# Patient Record
Sex: Male | Born: 1975
Health system: Southern US, Community
[De-identification: ages and names within clinical notes are randomized; demographics above are authoritative.]

## PROBLEM LIST (undated history)

## (undated) DIAGNOSIS — S060X9A Concussion with loss of consciousness of unspecified duration, initial encounter: Secondary | ICD-10-CM

## (undated) HISTORY — DX: Concussion with loss of consciousness of unspecified duration, initial encounter: S06.0X9A

---

## 2009-06-01 HISTORY — PX: SHOULDER SURGERY: SHX246

## 2010-06-01 DIAGNOSIS — S060X9A Concussion with loss of consciousness of unspecified duration, initial encounter: Secondary | ICD-10-CM

## 2010-06-01 DIAGNOSIS — S060XAA Concussion with loss of consciousness status unknown, initial encounter: Secondary | ICD-10-CM

## 2010-06-01 HISTORY — DX: Concussion with loss of consciousness of unspecified duration, initial encounter: S06.0X9A

## 2010-06-01 HISTORY — DX: Concussion with loss of consciousness status unknown, initial encounter: S06.0XAA

## 2012-08-26 ENCOUNTER — Ambulatory Visit (INDEPENDENT_AMBULATORY_CARE_PROVIDER_SITE_OTHER): Payer: 59 | Admitting: Family Medicine

## 2012-08-26 VITALS — BP 128/82 | HR 68 | Temp 98.0°F | Resp 17 | Ht 70.0 in | Wt 158.0 lb

## 2012-08-26 DIAGNOSIS — J069 Acute upper respiratory infection, unspecified: Secondary | ICD-10-CM

## 2012-08-26 DIAGNOSIS — J029 Acute pharyngitis, unspecified: Secondary | ICD-10-CM

## 2012-08-26 MED ORDER — CEFDINIR 300 MG PO CAPS: 300.0000 mg | ORAL_CAPSULE | Freq: Two times a day (BID) | ORAL | Status: DC

## 2012-08-26 MED ORDER — FIRST-DUKES MOUTHWASH MT SUSP: 5.0000 mL | Freq: Four times a day (QID) | OROMUCOSAL | Status: DC | PRN

## 2012-08-26 NOTE — Progress Notes (Signed)
[facility] [address], [place] [address] [phone]  Date:  08/26/2012   Name:  Carl Moss   DOB:  1975-06-29   MRN:  [id number]  PCP:  No primary provider on file.    Chief Complaint: Sinusitis, Nasal Congestion, Sore Throat, Chest Pain and Otalgia   History of Present Illness:  Carl Moss is a 37 y.o. very pleasant male patient who presents with the following:  Here today with illness.  Woke up this am with a stuffy nose, ear pain, sore throat.  Chest congestion.  No meds taken at home so far today.  He does have a dry cough.  He feels tired, but has not noted any body aches.  His chest felt tight this am- then he coughed and it seemed to loosen up, not bothering him right now  No GI symptoms.    He is otherwise generally healthy He is using a hand brace for CTS on the left hand, and is using naproxen as needed for this.  However he has not used this in a week or so NKDA.    Their 52 year old daughter was recently diagnosed with a "sinus infection" and is being treated with augmenin  There is no problem list on file for this patient.   History reviewed. No pertinent past medical history.  History reviewed. No pertinent past surgical history.  History  Substance Use Topics  . Smoking status: Current Every Day Smoker -- 0.75 packs/day for 17 years    Types: Cigarettes  . Smokeless tobacco: Not on file  . Alcohol Use: No    Family History  Problem Relation Age of Onset  . Hyperlipidemia Father   . Hypertension Father   . Angioedema Daughter     No Known Allergies  Medication list has been reviewed and updated.  No current outpatient prescriptions on file prior to visit.   No current facility-administered medications on file prior to visit.    Review of Systems:  As per HPI- otherwise negative.   Physical Examination: Filed Vitals:   08/26/12 1101  BP: 128/82  Pulse: 68  Temp: 98 F (36.7 C)  Resp: 17   Filed  Vitals:   08/26/12 1101  Height: 5\' 10"  (1.778 m)  Weight: 158 lb (71.668 kg)   Body mass index is 22.67 kg/(m^2). Ideal Body Weight: Weight in (lb) to have BMI = 25: 173.9  GEN: WDWN, NAD, Non-toxic, A & O x 3 HEENT: Atraumatic, Normocephalic. Neck supple. No masses, No LAD.  Bilateral TM wnl, oropharynx inflamed, no exudate or swelling, nasal cavity inflamed and congested.  PEERL,EOMI.   Ears and Nose: No external deformity. CV: RRR, No M/G/R. No JVD. No thrill. No extra heart sounds. PULM: CTA B, no wheezes, crackles, rhonchi. No retractions. No resp. distress. No accessory muscle use. ABD: S, NT, ND, +BS. No rebound. No HSM. EXTR: No c/c/e NEURO Normal gait.  PSYCH: Normally interactive. Conversant. Not depressed or anxious appearing.  Calm demeanor.   Results for orders placed in visit on 08/26/12  POCT RAPID STREP A (OFFICE)      Result Value Range   Rapid Strep A Screen Negative  Negative    Assessment and Plan: Acute pharyngitis - Plan: POCT rapid strep A, cefdinir (OMNICEF) 300 MG capsule, Diphenhyd-Hydrocort-Nystatin (FIRST-DUKES MOUTHWASH) SUSP  Acute upper respiratory infections of unspecified site DMM, omnicef rx to hold  Suspect viral URI- pt instructions: It seems that you have a viral URI  today.  Use the mouth wash as needed for your throat, and tylenol/ ibuprofen as needed.  Let me know if you are getting worse or not getting better- in that case we may need to have you start the omnicef rx.    Signed Abbe Amsterdam, MD

## 2012-08-26 NOTE — Patient Instructions (Addendum)
It seems that you have a viral URI today.  Use the mouth wash as needed for your throat, and tylenol/ ibuprofen as needed.  Let me know if you are getting worse or not getting better- in that case we may need to have you start the omnicef rx.

## 2012-11-18 ENCOUNTER — Ambulatory Visit (INDEPENDENT_AMBULATORY_CARE_PROVIDER_SITE_OTHER): Payer: 59 | Admitting: Internal Medicine

## 2012-11-18 ENCOUNTER — Ambulatory Visit: Payer: 59

## 2012-11-18 DIAGNOSIS — M25532 Pain in left wrist: Secondary | ICD-10-CM

## 2012-11-18 DIAGNOSIS — M25539 Pain in unspecified wrist: Secondary | ICD-10-CM

## 2012-11-18 MED ORDER — PREDNISONE 20 MG PO TABS: ORAL_TABLET | ORAL | Status: DC

## 2012-11-18 NOTE — Progress Notes (Signed)
  Subjective:    Patient ID: Carl Moss, male    DOB: 06-12-1975, 37 y.o.   MRN: [id number]  HPI complaining of left wrist pain for several, progressively worse He uses hands daily for making pizzas No known injury Started with pain at the ulnar aspect of the wrist intermittently This is progressed to being constant, interfering with work, with pain spreading to the forearm and the fourth and fifth digits, to include paresthesias and nocturnal symptoms He notices weakness above and beyond the pain  He is failed to respond to a brace and NSAIDs of 2 types    Review of Systems     Objective:   Physical Exam Left elbow and shoulder are normal Left wrist has tenderness to palpation over the ulnar aspect of the carpals both dorsal and volar/no swelling or redness Pain exaggerated with wrist extension Negative Tinels Grip 5 over 5 Finger thumb opposition intact No sensory loss noted        [facility] reading (PRIMARY) by  Dr. Aki Burdin=No bony abnormality    Assessment & Plan:  He is currently developing chronic wrist pain is interfering with his location and may represent early carpal tunnel syndrome or a carpal dissociative disorder Refer to Dr.Ortmann for evaluation  Trial of prednisone/continue wrist splint

## 2013-01-21 ENCOUNTER — Ambulatory Visit (INDEPENDENT_AMBULATORY_CARE_PROVIDER_SITE_OTHER): Payer: 59 | Admitting: Family Medicine

## 2013-01-21 ENCOUNTER — Encounter: Payer: Self-pay | Admitting: Family Medicine

## 2013-01-21 VITALS — BP 100/62 | HR 88 | Temp 98.5°F | Resp 16 | Ht 69.0 in | Wt 155.8 lb

## 2013-01-21 DIAGNOSIS — R51 Headache: Secondary | ICD-10-CM

## 2013-01-21 DIAGNOSIS — L0202 Furuncle of face: Secondary | ICD-10-CM

## 2013-01-21 MED ORDER — DOXYCYCLINE HYCLATE 100 MG PO CAPS: 100.0000 mg | ORAL_CAPSULE | Freq: Two times a day (BID) | ORAL | Status: DC

## 2013-01-21 NOTE — Progress Notes (Signed)
Procedure: Consent obtained.  Local anesthesia with 2% lido.  #18g needle used to open indurated area with purulence expressed.  #11 blade to make the incision larger and more purulence expressed.  Drsg placed with antibiotic ointment to prevent scabbing.

## 2013-01-21 NOTE — Progress Notes (Signed)
[address]   [place], [place]  [address]   [phone]  Subjective:    Patient ID: Carl Moss, male    DOB: 1975/07/17, 37 y.o.   MRN: [id number]  HPI This 37 y.o. male presents for evaluation of insect bite forehead.  Onset three days ago.  Awoke with red bump.  +HA  No fever/chills/sweats.  No itching.  +swelling; +Painful.  Sleeps heavily.  Applied topcial cream without improvement.  Tried to squeeze it.  No drainage.  No visible puncture.  Wife and daughter have recently had similar "spider bites" that have spontaneously resolved.   Review of Systems  Constitutional: Negative for fever, chills, diaphoresis and fatigue.  Skin: Positive for color change and wound. Negative for pallor and rash.  Neurological: Positive for headaches.    History reviewed. No pertinent past medical history.  Past Surgical History  Procedure Laterality Date  . Shoulder surgery      Prior to Admission medications   Medication Sig Start Date End Date Taking? Authorizing Provider  cefdinir (OMNICEF) 300 MG capsule Take 1 capsule (300 mg total) by mouth 2 (two) times daily. 08/26/12   Pearline Cables, MD  Diphenhyd-Hydrocort-Nystatin (FIRST-DUKES MOUTHWASH) SUSP Use as directed 5 mLs in the mouth or throat 4 (four) times daily as needed. Swish, gargle and then spit 08/26/12   Pearline Cables, MD  predniSONE (DELTASONE) 20 MG tablet 3/3/2/2/1/1 single daily dose for 6 days 11/18/12   Tonye Pearson, MD    No Known Allergies  History   Social History  . Marital Status: Married    Spouse Name: N/A    Number of Children: N/A  . Years of Education: N/A   Occupational History  . Not on file.   Social History Main Topics  . Smoking status: Current Every Day Smoker -- 0.75 packs/day for 17 years    Types: Cigarettes  . Smokeless tobacco: Not on file  . Alcohol Use: No  . Drug Use: No  . Sexual Activity: Yes    Birth Control/ Protection: None   Other Topics Concern  . Not on file    Social History Narrative  . No narrative on file    Family History  Problem Relation Age of Onset  . Hyperlipidemia Father   . Hypertension Father   . Angioedema Daughter        Objective:   Physical Exam  Nursing note and vitals reviewed. Constitutional: He appears well-developed and well-nourished. No distress.  HENT:  Head: Atraumatic.  Eyes: Conjunctivae and EOM are normal. Pupils are equal, round, and reactive to light.  Neck: Normal range of motion. Neck supple.  Skin: He is not diaphoretic. There is erythema.     Facial region at brows with 2.5 cm area of induration, erythema, tenderness.  Possible fluctuants mild and early; no pustule or vesicle.  Psychiatric: He has a normal mood and affect. His behavior is normal.       Assessment & Plan:  Facial pain  Carbuncle and furuncle of face  1.  Facial pain:  New.  Secondary to carbuncle and faruncle.  Ibuprofen 800mg  tid with food.  2. Carbuncle/faruncle facial: New. S/p I&D with 18 gauge needle; wound culture obtained; rx for Doxycycline provided. RTC immediately for increasing pain, redness, swelling.  Meds ordered this encounter  Medications  . doxycycline (VIBRAMYCIN) 100 MG capsule    Sig: Take 1 capsule (100 mg total) by mouth 2 (two) times daily.    Dispense:  20 capsule    Refill:  0

## 2013-01-21 NOTE — Patient Instructions (Addendum)
1.  HEAT TO AREA FOR 15-20 MINUTES. 2.  RETURN FOR FEVER, INCREASING PAIN, INCREASING REDNESS, INCREASING SWELLING.

## 2013-01-24 LAB — WOUND CULTURE
Gram Stain: NONE SEEN
Organism ID, Bacteria: NO GROWTH

## 2013-01-25 ENCOUNTER — Telehealth: Payer: Self-pay | Admitting: Radiology

## 2013-01-25 NOTE — Telephone Encounter (Signed)
Patients wife advised culture shows no growth.

## 2013-02-15 ENCOUNTER — Ambulatory Visit (INDEPENDENT_AMBULATORY_CARE_PROVIDER_SITE_OTHER): Payer: 59 | Admitting: Family Medicine

## 2013-02-15 VITALS — BP 112/62 | HR 72 | Temp 98.3°F | Resp 16 | Ht 70.0 in | Wt 158.0 lb

## 2013-02-15 DIAGNOSIS — D841 Defects in the complement system: Secondary | ICD-10-CM

## 2013-02-15 DIAGNOSIS — R634 Abnormal weight loss: Secondary | ICD-10-CM

## 2013-02-15 DIAGNOSIS — Z13220 Encounter for screening for lipoid disorders: Secondary | ICD-10-CM

## 2013-02-15 DIAGNOSIS — E889 Metabolic disorder, unspecified: Secondary | ICD-10-CM

## 2013-02-15 NOTE — Progress Notes (Signed)
[facility] [address] [place] [address] [phone]  Date:  02/15/2013   Name:  Carl Moss   DOB:  1975/07/01   MRN:  [id number]  PCP:  No primary provider on file.    Chief Complaint: Mass   History of Present Illness:  Carl Moss is a 37 y.o. very pleasant male patient who presents with the following:  He has noted a possible cyst on his forehead.  He was here about 4 weeks ago with same- treated with doxycycline.  He had a needle I and D- culture was negative.  The area is smaller but persistent.  He also noted a smaller, but similar area on the right side of his forehead about 2 months ago.  The areas are not painful, but he is concerned that they have not gone away.  He does not have any other cysts or skin issues  Daughter with hereditary angioedema type 3.  He would like to have testing today to see if he is a carrier.    He is fasting today and would like to have basic labs for his age as well.    His wife has noted some weight loss.  This seems to be due to him just not eating very much.  However, chart review shows stable weight over the last 6 months.   Wt Readings from Last 3 Encounters:  02/15/13 158 lb (71.668 kg)  01/21/13 155 lb 12.8 oz (70.67 kg)  08/26/12 158 lb (71.668 kg)     There are no active problems to display for this patient.   History reviewed. No pertinent past medical history.  Past Surgical History  Procedure Laterality Date  . Shoulder surgery      History  Substance Use Topics  . Smoking status: Current Every Day Smoker -- 0.75 packs/day for 17 years    Types: Cigarettes  . Smokeless tobacco: Not on file  . Alcohol Use: No    Family History  Problem Relation Age of Onset  . Hyperlipidemia Father   . Hypertension Father   . Angioedema Daughter     No Known Allergies  Medication list has been reviewed and updated.  Current Outpatient Prescriptions on File Prior to Visit  Medication Sig  Dispense Refill  . cefdinir (OMNICEF) 300 MG capsule Take 1 capsule (300 mg total) by mouth 2 (two) times daily.  20 capsule  0  . Diphenhyd-Hydrocort-Nystatin (FIRST-DUKES MOUTHWASH) SUSP Use as directed 5 mLs in the mouth or throat 4 (four) times daily as needed. Swish, gargle and then spit  237 mL  0  . doxycycline (VIBRAMYCIN) 100 MG capsule Take 1 capsule (100 mg total) by mouth 2 (two) times daily.  20 capsule  0  . predniSONE (DELTASONE) 20 MG tablet 3/3/2/2/1/1 single daily dose for 6 days  12 tablet  0   No current facility-administered medications on file prior to visit.    Review of Systems:  As per HPI- otherwise negative.   Physical Examination: Filed Vitals:   02/15/13 1259  BP: 112/62  Pulse: 72  Temp: 98.3 F (36.8 C)  Resp: 16   Filed Vitals:   02/15/13 1259  Height: 5\' 10"  (1.778 m)  Weight: 158 lb (71.668 kg)   Body mass index is 22.67 kg/(m^2). Ideal Body Weight: Weight in (lb) to have BMI = 25: 173.9  GEN: WDWN, NAD, Non-toxic, A & O x 3 HEENT: Atraumatic, Normocephalic. Neck supple. No masses, No LAD.  Bilateral TM wnl, oropharynx normal.  PEERL,EOMI.   Tiny, BB sized apparent mobile cyst over the right brow.  There is a larger, mobile cyst in between the brows, the size of a small pea.  Both are firm, circular and mobile consistent with cysts Ears and Nose: No external deformity. CV: RRR, No M/G/R. No JVD. No thrill. No extra heart sounds. PULM: CTA B, no wheezes, crackles, rhonchi. No retractions. No resp. distress. No accessory muscle use. EXTR: No c/c/e NEURO Normal gait.  PSYCH: Normally interactive. Conversant. Not depressed or anxious appearing.  Calm demeanor.    Assessment and Plan: Screening for hyperlipidemia - Plan: Lipid panel, CBC with Differential  Loss of weight - Plan: Comprehensive metabolic panel, CBC with Differential, CANCELED: POCT CBC  Hereditary angioedema - Plan: C4 complement, C1 Esterase Inhibitor Panel, CBC with  Differential  Labs as above.  Cysts on forehead.  He admits to trying to squeeze the once between his brows some, which may be increasing its size.  He will let me know how they do over the next month or so, if the cysts do not decrease in size he will let me know and I will refer to derm or plastics   Signed Abbe Amsterdam, MD

## 2013-02-15 NOTE — Patient Instructions (Addendum)
I will be in touch with your labs.   If you decide you would like to do anything further about your cysts please let me know

## 2013-02-17 LAB — LIPID PANEL
Cholesterol: 156 mg/dL (ref 0–200)
HDL: 36 mg/dL — ABNORMAL LOW (ref >39)
Total CHOL/HDL Ratio: 4.3 Ratio
VLDL: 34 mg/dL (ref 0–40)

## 2013-02-17 LAB — COMPREHENSIVE METABOLIC PANEL
ALT: 12 U/L (ref 0–53)
AST: 16 U/L (ref 0–37)
Albumin: 4.7 g/dL (ref 3.5–5.2)
Calcium: 10.1 mg/dL (ref 8.4–10.5)
Chloride: 104 mEq/L (ref 96–112)
Potassium: 4.4 mEq/L (ref 3.5–5.3)
Total Protein: 7.6 g/dL (ref 6.0–8.3)

## 2013-02-17 LAB — CBC WITH DIFFERENTIAL/PLATELET
Basophils Relative: 0 % (ref 0–1)
Eosinophils Absolute: 0.3 10*3/uL (ref 0.0–0.7)
Eosinophils Relative: 3 % (ref 0–5)
HCT: 44.9 % (ref 39.0–52.0)
Hemoglobin: 15.1 g/dL (ref 13.0–17.0)
MCH: 29.4 pg (ref 26.0–34.0)
MCHC: 33.6 g/dL (ref 30.0–36.0)
Monocytes Absolute: 0.6 10*3/uL (ref 0.1–1.0)
Monocytes Relative: 7 % (ref 3–12)

## 2013-02-22 ENCOUNTER — Telehealth: Payer: Self-pay | Admitting: Family Medicine

## 2013-02-22 NOTE — Telephone Encounter (Signed)
Gave patients wife his lab results

## 2013-02-23 ENCOUNTER — Encounter: Payer: Self-pay | Admitting: Family Medicine

## 2013-06-10 ENCOUNTER — Ambulatory Visit (INDEPENDENT_AMBULATORY_CARE_PROVIDER_SITE_OTHER): Payer: 59 | Admitting: Family Medicine

## 2013-06-10 VITALS — BP 104/62 | HR 74 | Temp 98.7°F | Resp 16 | Ht 69.0 in | Wt 151.0 lb

## 2013-06-10 DIAGNOSIS — S0550XA Penetrating wound with foreign body of unspecified eyeball, initial encounter: Secondary | ICD-10-CM

## 2013-06-10 DIAGNOSIS — H44703 Unspecified retained (old) intraocular foreign body, nonmagnetic, bilateral: Secondary | ICD-10-CM

## 2013-06-10 MED ORDER — TOBRAMYCIN 0.3 % OP SOLN: 1.0000 [drp] | Freq: Four times a day (QID) | OPHTHALMIC | Status: DC
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
## 2013-06-10 NOTE — Patient Instructions (Signed)
Wash pillow cases °

## 2014-03-15 ENCOUNTER — Ambulatory Visit (INDEPENDENT_AMBULATORY_CARE_PROVIDER_SITE_OTHER): Payer: 59 | Admitting: Family Medicine

## 2014-03-15 VITALS — BP 112/68 | HR 59 | Temp 97.2°F | Resp 16 | Ht 69.0 in | Wt 165.8 lb

## 2014-03-15 DIAGNOSIS — L723 Sebaceous cyst: Secondary | ICD-10-CM

## 2014-03-15 NOTE — Progress Notes (Signed)
I was directly involved in the patient's care and actively participated during the procedure.  

## 2014-03-15 NOTE — Progress Notes (Signed)
   [id number] secondary infection.  Local wound care reviewed.   No orders of the defined types were placed in this encounter.   I personally performed the services described in this documentation, which was scribed in my presence.  The recorded information has been reviewed and is accurate.  Abdelaziz Westenberger, M.D.  [facility]  [place] [address] [place], [place]  [address] [phone] Artho35mr C<1610ADArtho40mr C ptaininArthoGrace Hospital6Private [phone] linic PLLC1610 CArtho57mr C ptain>pt1610inArtho40mr C pta1610neArtho9mr aptainix [facility] Dupag773 [phone] [id number] [phone]

## 2014-03-15 NOTE — Progress Notes (Signed)
Procedure: Consent obtained. Used 1% Lidocaine with Epi for anesthesia.  Anesthesia successful.  Cleaned with Povidine.  15 blade for 1cm superficial excision through the epidermis transverse plane and across the punctum.  1cm sebaceous cyst opened with material drainage.  Applied pressure for more ample drainage.  Sebaceous material and cyst bag excised. 6-0 ethicon used in 2 simple interrupted sutures for closure.

## 2014-03-19 ENCOUNTER — Ambulatory Visit (INDEPENDENT_AMBULATORY_CARE_PROVIDER_SITE_OTHER): Payer: 59 | Admitting: Family Medicine

## 2014-03-19 DIAGNOSIS — Z4802 Encounter for removal of sutures: Secondary | ICD-10-CM

## 2014-03-19 NOTE — Progress Notes (Signed)
   Subjective:    Patient ID: Carl Moss, male    DOB: 05/07/1976, 38 y.o.   MRN: [id number]  HPI Patient presents today for suture removal. Had two sutures placed 10/15 following sebaceous cyst removal. Has tolerated sutures without redness or drainage.    Review of Systems     Objective:   Physical Exam  Constitutional: He is oriented to person, place, and time. He appears well-developed and well-nourished.  HENT:  Head: Normocephalic.    Eyes: Conjunctivae are normal.  Neck: Normal range of motion. Neck supple.  Pulmonary/Chest: Effort normal.  Musculoskeletal: Normal range of motion.  Neurological: He is alert and oriented to person, place, and time.  Skin: Skin is warm and dry.  Psychiatric: He has a normal mood and affect. His behavior is normal. Judgment and thought content normal.       Assessment & Plan:  1. Visit for suture removal -ok for suture removal.   Emi Belfasteborah B. Dominique Ressel, FNP-BC  [facility]  03/19/2014 10:41 AM

## 2014-09-11 IMAGING — CR DG WRIST COMPLETE 3+V*L*
2 series · 2 of 2 positions shown · non-contrast
Comparison: None.

CLINICAL DATA: Wrist pain.

LEFT WRIST - COMPLETE 3+ VIEW

[PA]
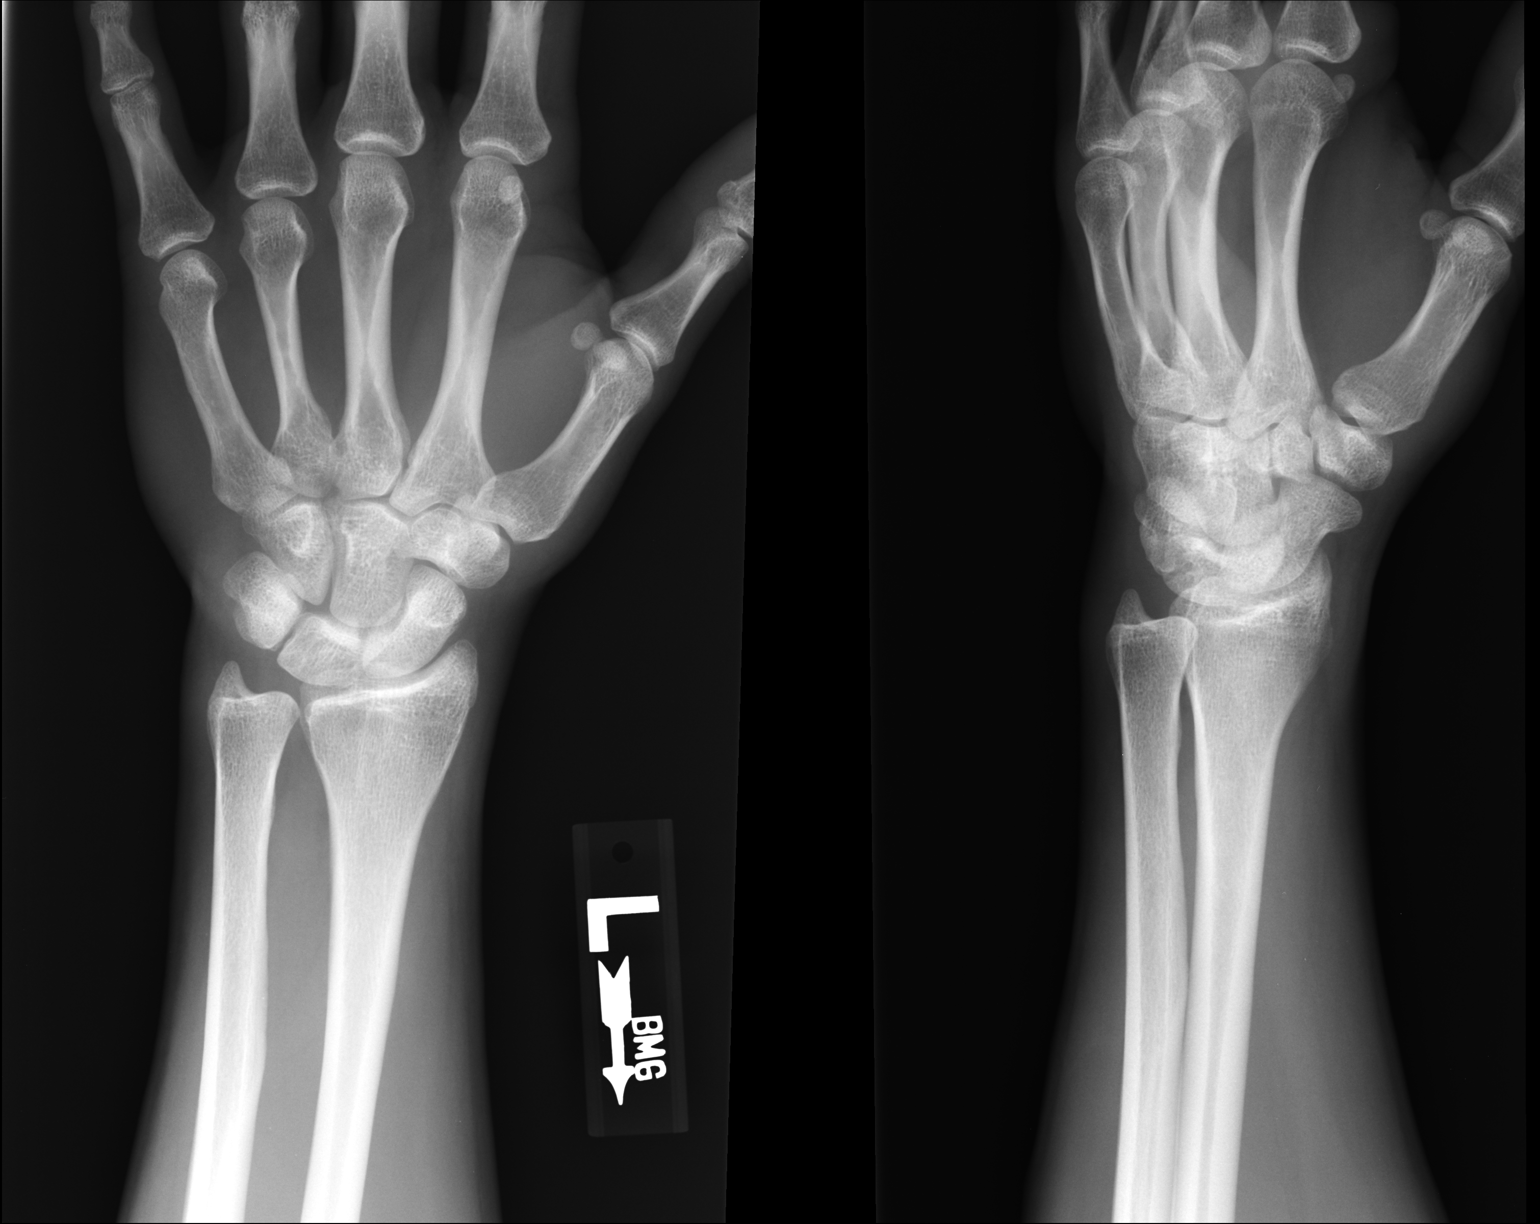

[lateral]
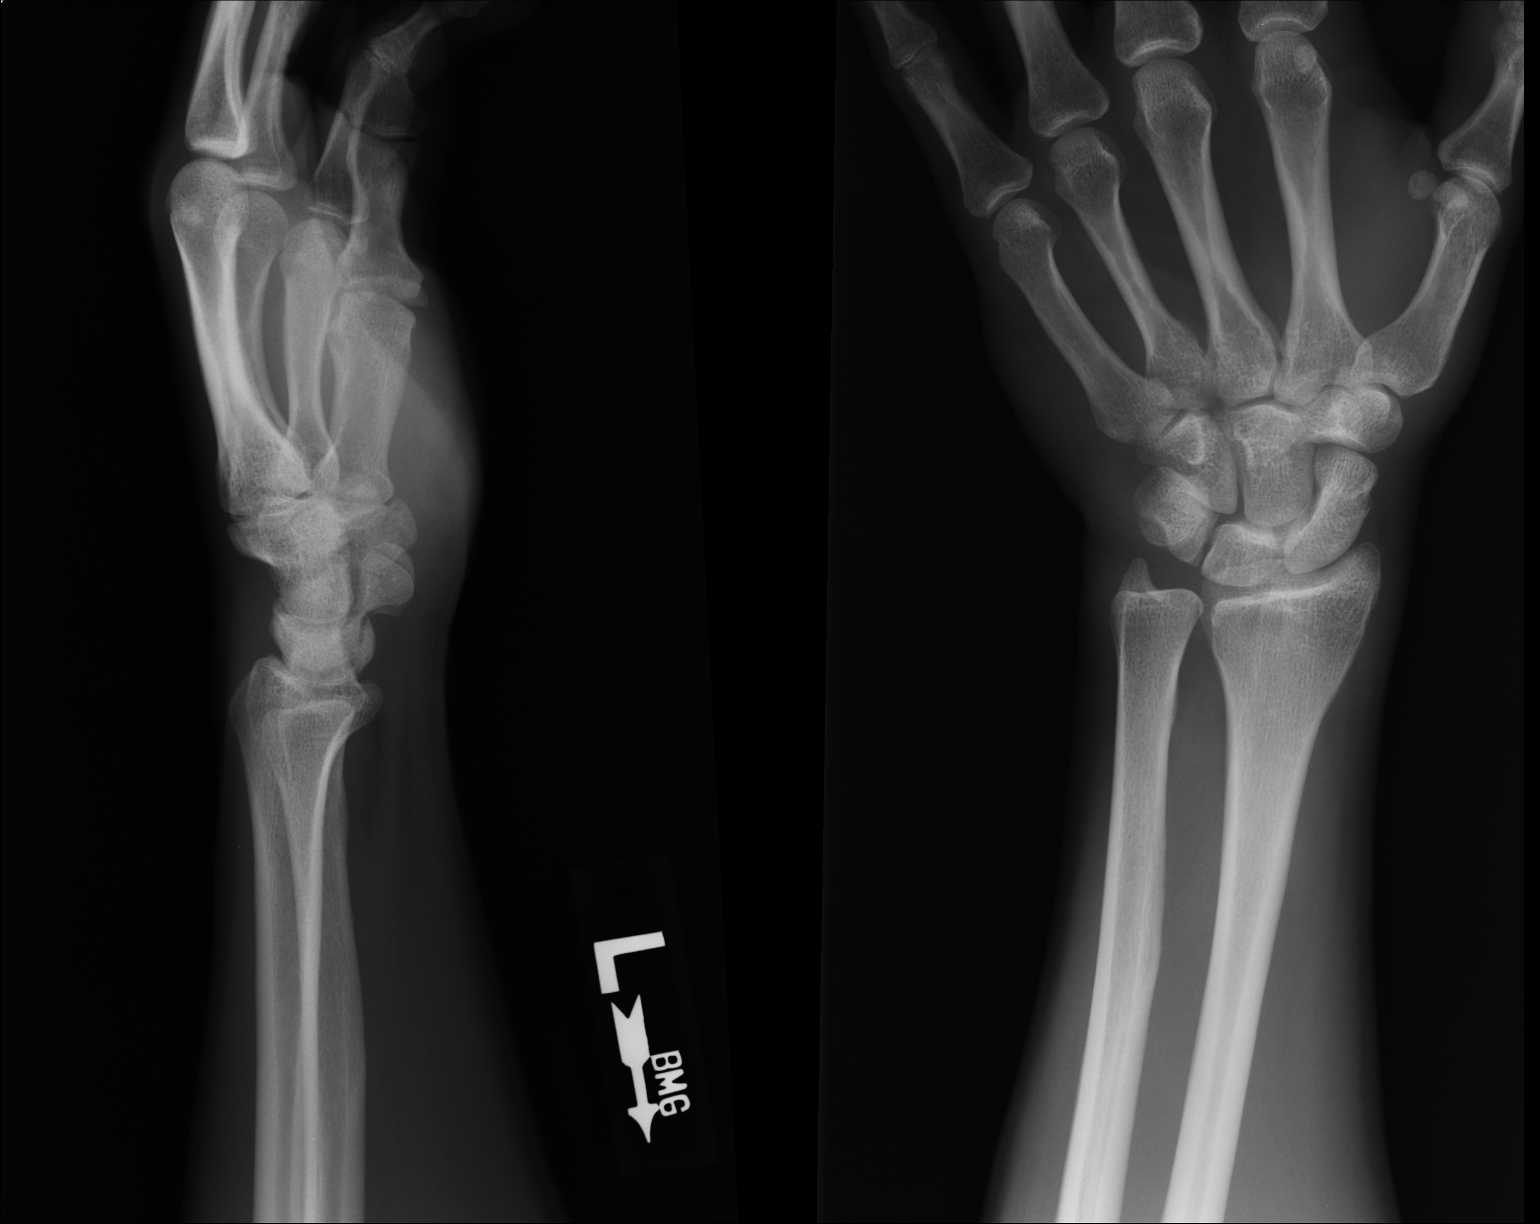

[2 of 2 positions shown; findings below may reference images not displayed]

FINDINGS: No acute bony abnormality.  Specifically, no fracture,
subluxation, or dislocation.  Soft tissues are intact. Joint spaces
are maintained.  Normal bone mineralization.
IMPRESSION: Negative.

Clinically significant discrepancy from primary report, if
provided: None

## 2014-10-01 ENCOUNTER — Ambulatory Visit (INDEPENDENT_AMBULATORY_CARE_PROVIDER_SITE_OTHER): Payer: 59 | Admitting: Family Medicine

## 2014-10-01 VITALS — BP 100/62 | HR 63 | Temp 98.0°F | Resp 16 | Ht 69.0 in | Wt 162.4 lb

## 2014-10-01 DIAGNOSIS — G44309 Post-traumatic headache, unspecified, not intractable: Secondary | ICD-10-CM

## 2014-10-01 DIAGNOSIS — Z87820 Personal history of traumatic brain injury: Secondary | ICD-10-CM | POA: Diagnosis not present

## 2014-10-01 DIAGNOSIS — S060X0A Concussion without loss of consciousness, initial encounter: Secondary | ICD-10-CM

## 2014-10-01 MED ORDER — ACETAMINOPHEN-CODEINE #2 300-15 MG PO TABS: 1.0000 | ORAL_TABLET | ORAL | Status: DC | PRN
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
## 2014-10-03 NOTE — Progress Notes (Signed)
Patient ID: Carl Moss, male   DOB: 01/25/1976, 39 y.o.   MRN: [id number] Reviewed documentation and agree w/ assessment and plan. Norberto SorensonEva Braylie Badami, MD MPH

## 2014-10-09 ENCOUNTER — Ambulatory Visit (INDEPENDENT_AMBULATORY_CARE_PROVIDER_SITE_OTHER): Payer: 59 | Admitting: Diagnostic Neuroimaging

## 2014-10-09 ENCOUNTER — Encounter: Payer: Self-pay | Admitting: Diagnostic Neuroimaging

## 2014-10-09 VITALS — BP 116/72 | HR 65 | Ht 69.0 in | Wt 162.7 lb

## 2014-10-09 DIAGNOSIS — G44309 Post-traumatic headache, unspecified, not intractable: Secondary | ICD-10-CM | POA: Diagnosis not present

## 2014-10-09 DIAGNOSIS — F0781 Postconcussional syndrome: Secondary | ICD-10-CM | POA: Diagnosis not present

## 2014-10-09 MED ORDER — AMITRIPTYLINE HCL 25 MG PO TABS: 25.0000 mg | ORAL_TABLET | Freq: Every day | ORAL | Status: DC

## 2014-10-09 NOTE — Progress Notes (Signed)
[facility]  PATIENT: Carl Moss DOB: 08/11/1975  REFERRING CLINICIAN: Chestine Sporelark HISTORY FROM: patient and wife  REASON FOR VISIT: new consult    HISTORICAL  CHIEF COMPLAINT:  Chief Complaint  Patient presents with  . New Evaluation    history of concussion    HISTORY OF PRESENT ILLNESS:   39 year old right-handed male here for evaluation of concussion and postconcussion symptoms.  09/17/2014, patient struck his head when exiting a shed on overhead beam. Initially had pain on the top of his head but was able to continue his activities. One to 2 days later he felt fogginess, disconnected feeling, ringing in ears, severe headaches, increased sleep, photophobia, confusion. Over the next 1 week symptoms continue to worsen. He was having headaches which increased as he exerted himself. It sharp and dull aching sensations. Intermittent nausea. He was sleeping more than usual. Normally he has a slightly abnormal sleep schedule, typically falling asleep between 2 and 4 in the morning and waking up around noon. Now he is waking up around 4 PM.  No symptoms in his neck, arms, legs. The numbness tingling or weakness.   He has history of concussion 10/19/2010.   REVIEW OF SYSTEMS: Full 14 system review of systems performed and notable only for fatigue ringing in ears blurred vision eye pain memory loss confusion headache dizziness sleepiness too much sleep decreased energy.   ALLERGIES: No Known Allergies  HOME MEDICATIONS: Outpatient Prescriptions Prior to Visit  Medication Sig Dispense Refill  . acetaminophen-codeine (TYLENOL #2) 300-15 MG per tablet Take 1 tablet by mouth every 4 (four) hours as needed for moderate pain. 30 tablet 0   No facility-administered medications prior to visit.    PAST MEDICAL HISTORY: Past Medical History  Diagnosis Date  . Concussion 2012    PAST SURGICAL HISTORY: Past Surgical History  Procedure Laterality Date  .  Shoulder surgery Right 2011    FAMILY HISTORY: Family History  Problem Relation Age of Onset  . Hyperlipidemia Father   . Hypertension Father   . Angioedema Daughter     SOCIAL HISTORY:  History   Social History  . Marital Status: Married    Spouse Name: Huntley DecSara  . Number of Children: 2  . Years of Education: college    Occupational History  . [employer]    Social History Main Topics  . Smoking status: Former Smoker -- 0.75 packs/day for 17 years    Types: Cigarettes    Quit date: 04/15/2013  . Smokeless tobacco: Never Used  . Alcohol Use: No  . Drug Use: No  . Sexual Activity: Yes    Birth Control/ Protection: None   Other Topics Concern  . Not on file   Social History Narrative   Lives at home with his wife and children   Drinks about 18 oz of caffeine a day      PHYSICAL EXAM  Filed Vitals:   10/09/14 1313  BP: 116/72  Pulse: 65  Height: 5\' 9"  (1.753 m)  Weight: 162 lb 11.2 oz (73.8 kg)    Body mass index is 24.02 kg/(m^2).   Visual Acuity Screening   Right eye Left eye Both eyes  Without correction: 20/30 20/30   With correction:       No flowsheet data found.  GENERAL EXAM: Patient is in no distress; well developed, nourished and groomed; neck is supple; SLIGHT DISCOMFORT APPEARANCE; SITTING IN DARKENED ROOM  CARDIOVASCULAR: Regular rate and rhythm, no murmurs, no carotid bruits  NEUROLOGIC: MENTAL STATUS: awake, alert, oriented to person, place and time, recent and remote memory intact, normal attention and concentration, language fluent, comprehension intact, naming intact, fund of knowledge appropriate CRANIAL NERVE: no papilledema on fundoscopic exam, pupils equal and reactive to light, visual fields full to confrontation, extraocular muscles intact, no nystagmus, facial sensation and strength symmetric, hearing intact, palate elevates symmetrically, uvula midline, shoulder shrug symmetric, tongue midline. MOTOR: normal bulk and tone, full  strength in the BUE, BLE SENSORY: normal and symmetric to light touch, pinprick, temperature, vibration  COORDINATION: finger-nose-finger, fine finger movements normal REFLEXES: deep tendon reflexes present and symmetric GAIT/STATION: narrow based gait; able to walk on toes, heels and tandem; romberg is negative    DIAGNOSTIC DATA (LABS, IMAGING, TESTING) - I reviewed patient records, labs, notes, testing and imaging myself where available.  Lab Results  Component Value Date   WBC 8.9 02/15/2013   HGB 15.1 02/15/2013   HCT 44.9 02/15/2013   MCV 87.4 02/15/2013   PLT 240 02/15/2013      Component Value Date/Time   NA 145 02/15/2013 1311   K 4.4 02/15/2013 1311   CL 104 02/15/2013 1311   CO2 28 02/15/2013 1311   GLUCOSE 92 02/15/2013 1311   BUN 12 02/15/2013 1311   CREATININE 0.91 02/15/2013 1311   CALCIUM 10.1 02/15/2013 1311   PROT 7.6 02/15/2013 1311   ALBUMIN 4.7 02/15/2013 1311   AST 16 02/15/2013 1311   ALT 12 02/15/2013 1311   ALKPHOS 57 02/15/2013 1311   BILITOT 0.8 02/15/2013 1311   Lab Results  Component Value Date   CHOL 156 02/15/2013   HDL 36* 02/15/2013   LDLCALC 86 02/15/2013   TRIG 168* 02/15/2013   CHOLHDL 4.3 02/15/2013   No results found for: HGBA1C No results found for: VITAMINB12 No results found for: TSH    ASSESSMENT AND PLAN  39 y.o. year old male here with concussion on 09/17/14.   Dx:  Post concussion syndrome - Plan: MR Brain Wo Contrast  Post-concussion headache - Plan: MR Brain Wo Contrast   PLAN: - check MRI brain to rule contusion or other secondary causes of HA --> patient would like to hold off for now and see if symptoms improve - trial of amitriptyline 25mg  qhs - rest, hydration, sleep, exercises and gradual increase in activities advised  Orders Placed This Encounter  Procedures  . MR Brain Wo Contrast   Meds ordered this encounter  Medications  . amitriptyline (ELAVIL) 25 MG tablet    Sig: Take 1 tablet (25 mg  total) by mouth at bedtime.    Dispense:  30 tablet    Refill:  3   Return in about 1 month (around 11/09/2014).    Suanne MarkerVIKRAM R. PENUMALLI, MD 10/09/2014, 2:10 PM Certified in Neurology, Neurophysiology and Neuroimaging  [facility] [address], [address] [place], [place] [address] [phone]

## 2014-10-09 NOTE — Patient Instructions (Signed)
Try amitriptyline 25mg  at bedtime.   I will check MRI brain.

## 2014-10-11 ENCOUNTER — Encounter: Payer: Self-pay | Admitting: Diagnostic Neuroimaging

## 2014-10-18 ENCOUNTER — Other Ambulatory Visit: Payer: 59

## 2014-11-12 ENCOUNTER — Ambulatory Visit (INDEPENDENT_AMBULATORY_CARE_PROVIDER_SITE_OTHER): Payer: 59 | Admitting: Family Medicine

## 2014-11-12 VITALS — BP 104/54 | HR 67 | Temp 98.2°F | Resp 16 | Ht 70.0 in | Wt 161.0 lb

## 2014-11-12 DIAGNOSIS — R590 Localized enlarged lymph nodes: Secondary | ICD-10-CM | POA: Diagnosis not present

## 2014-11-12 DIAGNOSIS — L739 Follicular disorder, unspecified: Secondary | ICD-10-CM | POA: Diagnosis not present

## 2014-11-12 MED ORDER — DOXYCYCLINE HYCLATE 100 MG PO CAPS: 100.0000 mg | ORAL_CAPSULE | Freq: Two times a day (BID) | ORAL | Status: DC

## 2014-11-12 NOTE — Progress Notes (Signed)
History and physical examinations obtained with PA [facility].  Agree with assessment and plan.  Eulia Hatcher Paulita Fujita, M.D. [facility] [address] [place], [place]  [address] [phone] phone [phone] fax

## 2014-11-12 NOTE — Progress Notes (Signed)
    MRN: [id number] DOB: 1975/08/03  Subjective:   Carl Moss is a 39 y.o. male presenting for chief complaint of Mass; Fatigue; Fever; and Headache  Reports 2 day history of left sided mass in arm pit. Mass is tender and has been worsening. Also reports subjective fever, malaise. Denies discharge, bleeding, swelling of shoulder or arm, wound. Of note, patient has had a cyst before over his forehead and has been dealing with folliculitis over his neck. He has not been seen for this and has not undergone any antibiotics. Denies any other aggravating or relieving factors, no other questions or concerns.  Flabio has a current medication list which includes the following prescription(s): acetaminophen, acetaminophen-codeine, ibuprofen, and amitriptyline. He has No Known Allergies.  Kyaire  has a past medical history of Concussion (2012). Also  has past surgical history that includes Shoulder surgery (Right, 2011).  ROS As in subjective.  Objective:   Vitals: BP 104/54 mmHg  Pulse 67  Temp(Src) 98.2 F (36.8 C)  Resp 16  Ht 5\' 10"  (1.778 m)  Wt 161 lb (73.029 kg)  BMI 23.10 kg/m2  SpO2 98%  Physical Exam  Constitutional: He appears well-developed and well-nourished.  Cardiovascular: Normal rate.   Pulmonary/Chest: Effort normal.    Lymphadenopathy:    He has axillary adenopathy (left sided with associated erythema).  There is ~0.5-1cm tender mass in left axillary region.  Skin: Skin is warm and dry. Rash noted. There is erythema. No pallor.   Assessment and Plan :   1. Folliculitis 2. Lymphadenopathy, axillary - Will start doxycycline to cover for infectious process, advise warm compresses and ibuprofen/NSAID for her fever and inflammation. Recommended he return to clinic in 3-4 days if there's no improvement in symptoms and definitely if his symptoms worsen as he may need I&D. Patient agreed.  Wallis Bamberg, PA-C [facility] [phone] 11/12/2014 5:37 PM

## 2014-11-12 NOTE — Patient Instructions (Signed)
Folliculitis  Folliculitis is redness, soreness, and swelling (inflammation) of the hair follicles. This condition can occur anywhere on the body. People with weakened immune systems, diabetes, or obesity have a greater risk of getting folliculitis. CAUSES  Bacterial infection. This is the most common cause.  Fungal infection.  Viral infection.  Contact with certain chemicals, especially oils and tars. Long-term folliculitis can result from bacteria that live in the nostrils. The bacteria may trigger multiple outbreaks of folliculitis over time. SYMPTOMS Folliculitis most commonly occurs on the scalp, thighs, legs, back, buttocks, and areas where hair is shaved frequently. An early sign of folliculitis is a small, white or yellow, pus-filled, itchy lesion (pustule). These lesions appear on a red, inflamed follicle. They are usually less than 0.2 inches (5 mm) wide. When there is an infection of the follicle that goes deeper, it becomes a boil or furuncle. A group of closely packed boils creates a larger lesion (carbuncle). Carbuncles tend to occur in hairy, sweaty areas of the body. DIAGNOSIS  Your caregiver can usually tell what is wrong by doing a physical exam. A sample may be taken from one of the lesions and tested in a lab. This can help determine what is causing your folliculitis. TREATMENT  Treatment may include:  Applying warm compresses to the affected areas.  Taking antibiotic medicines orally or applying them to the skin.  Draining the lesions if they contain a large amount of pus or fluid.  Laser hair removal for cases of long-lasting folliculitis. This helps to prevent regrowth of the hair. HOME CARE INSTRUCTIONS  Apply warm compresses to the affected areas as directed by your caregiver.  If antibiotics are prescribed, take them as directed. Finish them even if you start to feel better.  You may take over-the-counter medicines to relieve itching.  Do not shave  irritated skin.  Follow up with your caregiver as directed. SEEK IMMEDIATE MEDICAL CARE IF:   You have increasing redness, swelling, or pain in the affected area.  You have a fever. MAKE SURE YOU:  Understand these instructions.  Will watch your condition.  Will get help right away if you are not doing well or get worse. Document Released: 07/27/2001 Document Revised: 11/17/2011 Document Reviewed: 08/18/2011 [facility] Patient Information 2015 [place], [place]. This information is not intended to replace advice given to you by your health care provider. Make sure you discuss any questions you have with your health care provider.   Swollen Lymph Nodes The lymphatic system filters fluid from around cells. It is like a system of blood vessels. These channels carry lymph instead of blood. The lymphatic system is an important part of the immune (disease fighting) system. When people talk about "swollen glands in the neck," they are usually talking about swollen lymph nodes. The lymph nodes are like the little traps for infection. You and your caregiver may be able to feel lymph nodes, especially swollen nodes, in these common areas: the groin (inguinal area), armpits (axilla), and above the clavicle (supraclavicular). You may also feel them in the neck (cervical) and the back of the head just above the hairline (occipital). Swollen glands occur when there is any condition in which the body responds with an allergic type of reaction. For instance, the glands in the neck can become swollen from insect bites or any type of minor infection on the head. These are very noticeable in children with only minor problems. Lymph nodes may also become swollen when there is a tumor or problem  with the lymphatic system, such as Hodgkin's disease. TREATMENT   Most swollen glands do not require treatment. They can be observed (watched) for a short period of time, if your caregiver feels it is necessary. Most of the  time, observation is not necessary.  Antibiotics (medicines that kill germs) may be prescribed by your caregiver. Your caregiver may prescribe these if he or she feels the swollen glands are due to a bacterial (germ) infection. Antibiotics are not used if the swollen glands are caused by a virus. HOME CARE INSTRUCTIONS   Take medications as directed by your caregiver. Only take over-the-counter or prescription medicines for pain, discomfort, or fever as directed by your caregiver. SEEK MEDICAL CARE IF:   If you begin to run a temperature greater than 102 F (38.9 C), or as your caregiver suggests. MAKE SURE YOU:   Understand these instructions.  Will watch your condition.  Will get help right away if you are not doing well or get worse. Document Released: 05/08/2002 Document Revised: 08/10/2011 Document Reviewed: 05/18/2005 [facility] Patient Information 2015 [place], [place]. This information is not intended to replace advice given to you by your health care provider. Make sure you discuss any questions you have with your health care provider.

## 2014-11-15 ENCOUNTER — Encounter: Payer: Self-pay | Admitting: Diagnostic Neuroimaging

## 2014-11-15 ENCOUNTER — Ambulatory Visit (INDEPENDENT_AMBULATORY_CARE_PROVIDER_SITE_OTHER): Payer: 59 | Admitting: Diagnostic Neuroimaging

## 2014-11-15 VITALS — BP 110/70 | HR 83 | Temp 97.8°F | Ht 69.0 in | Wt 164.7 lb

## 2014-11-15 DIAGNOSIS — G44309 Post-traumatic headache, unspecified, not intractable: Secondary | ICD-10-CM

## 2014-11-15 DIAGNOSIS — F0781 Postconcussional syndrome: Secondary | ICD-10-CM

## 2014-11-15 NOTE — Progress Notes (Signed)
[facility]  PATIENT: Carl Moss DOB: [birth year]  REFERRING CLINICIAN: Chestine Spore HISTORY FROM: patient  REASON FOR VISIT: follow up   HISTORICAL  CHIEF COMPLAINT:  Chief Complaint  Patient presents with  . Follow-up    hx concussion, HA, rm 7, "nonstop HA x 5 days"  . Migraine    HISTORY OF PRESENT ILLNESS:   UPDATE 11/15/14: Since last visit, was doing better, took amitriptyline x 1 week, felt better then stopped it. Was getting better until last weekend, then had fever and left armpit swelling (? folliculitis). Now on doxycycline, and back on amitriptyline. Today feels a little bit better. Still with photosensitivity since the concussion.   PRIOR HPI (10/09/14): 39 year old right-handed male here for evaluation of concussion and postconcussion symptoms. 09/17/2014, patient struck his head when exiting a shed on overhead beam. Initially had pain on the top of his head but was able to continue his activities. One to 2 days later he felt fogginess, disconnected feeling, ringing in ears, severe headaches, increased sleep, photophobia, confusion. Over the next 1 week symptoms continue to worsen. He was having headaches which increased as he exerted himself. It sharp and dull aching sensations. Intermittent nausea. He was sleeping more than usual. Normally he has a slightly abnormal sleep schedule, typically falling asleep between 2 and 4 in the morning and waking up around noon. Now he is waking up around 4 PM. No symptoms in his neck, arms, legs. The numbness tingling or weakness. He has history of concussion 10/19/2010.   REVIEW OF SYSTEMS: Full 14 system review of systems performed and notable only for fatigue ringing in ears light sens headache dizziness swollen lymph nodes.   ALLERGIES: No Known Allergies  HOME MEDICATIONS: Outpatient Prescriptions Prior to Visit  Medication Sig Dispense Refill  . acetaminophen (TYLENOL) 500 MG tablet Take 1,000 mg by mouth  every 6 (six) hours as needed.    Marland Kitchen amitriptyline (ELAVIL) 25 MG tablet Take 1 tablet (25 mg total) by mouth at bedtime. 30 tablet 3  . doxycycline (VIBRAMYCIN) 100 MG capsule Take 1 capsule (100 mg total) by mouth 2 (two) times daily. 20 capsule 0  . ibuprofen (ADVIL,MOTRIN) 600 MG tablet Take 600 mg by mouth every 6 (six) hours as needed.    Marland Kitchen acetaminophen-codeine (TYLENOL #2) 300-15 MG per tablet Take 1 tablet by mouth every 4 (four) hours as needed for moderate pain. (Patient not taking: Reported on 11/15/2014) 30 tablet 0   No facility-administered medications prior to visit.    PAST MEDICAL HISTORY: Past Medical History  Diagnosis Date  . Concussion 2012    PAST SURGICAL HISTORY: Past Surgical History  Procedure Laterality Date  . Shoulder surgery Right 2011    FAMILY HISTORY: Family History  Problem Relation Age of Onset  . Hyperlipidemia Father   . Hypertension Father   . Angioedema Daughter     SOCIAL HISTORY:  History   Social History  . Marital Status: Married    Spouse Name: Huntley Dec  . Number of Children: 2  . Years of Education: college    Occupational History  . [employer]    Social History Main Topics  . Smoking status: Former Smoker -- 0.75 packs/day for 17 years    Types: Cigarettes    Quit date: 04/15/2013  . Smokeless tobacco: Never Used  . Alcohol Use: No  . Drug Use: No  . Sexual Activity: Yes    Birth Control/ Protection: None   Other Topics Concern  .  Not on file   Social History Narrative   Lives at home with his wife and children   Drinks about 18 oz of caffeine a day      PHYSICAL EXAM  Filed Vitals:   11/15/14 1244  BP: 110/70  Pulse: 83  Temp: 97.8 F (36.6 C)  Height: 5\' 9"  (1.753 m)  Weight: 164 lb 11.2 oz (74.707 kg)    Body mass index is 24.31 kg/(m^2).  No exam data present  No flowsheet data found.  GENERAL EXAM: Patient is in no distress; well developed, nourished and groomed; neck is  supple  CARDIOVASCULAR: Regular rate and rhythm, no murmurs, no carotid bruits  NEUROLOGIC: MENTAL STATUS: awake, alert, language fluent, comprehension intact, naming intact, fund of knowledge appropriate CRANIAL NERVE: pupils equal and reactive to light, visual fields full to confrontation, extraocular muscles intact, no nystagmus, facial sensation and strength symmetric, hearing intact, palate elevates symmetrically, uvula midline, shoulder shrug symmetric, tongue midline. MOTOR: normal bulk and tone, full strength in the BUE, BLE SENSORY: normal and symmetric to light touch COORDINATION: finger-nose-finger, fine finger movements normal REFLEXES: deep tendon reflexes present and symmetric GAIT/STATION: narrow based gait; able to walk tandem; romberg is negative    DIAGNOSTIC DATA (LABS, IMAGING, TESTING) - I reviewed patient records, labs, notes, testing and imaging myself where available.  Lab Results  Component Value Date   WBC 8.9 02/15/2013   HGB 15.1 02/15/2013   HCT 44.9 02/15/2013   MCV 87.4 02/15/2013   PLT 240 02/15/2013      Component Value Date/Time   NA 145 02/15/2013 1311   K 4.4 02/15/2013 1311   CL 104 02/15/2013 1311   CO2 28 02/15/2013 1311   GLUCOSE 92 02/15/2013 1311   BUN 12 02/15/2013 1311   CREATININE 0.91 02/15/2013 1311   CALCIUM 10.1 02/15/2013 1311   PROT 7.6 02/15/2013 1311   ALBUMIN 4.7 02/15/2013 1311   AST 16 02/15/2013 1311   ALT 12 02/15/2013 1311   ALKPHOS 57 02/15/2013 1311   BILITOT 0.8 02/15/2013 1311   Lab Results  Component Value Date   CHOL 156 02/15/2013   HDL 36* 02/15/2013   LDLCALC 86 02/15/2013   TRIG 168* 02/15/2013   CHOLHDL 4.3 02/15/2013   No results found for: HGBA1C No results found for: VITAMINB12 No results found for: TSH    ASSESSMENT AND PLAN  39 y.o. year old male here with concussion on 09/17/14. Has been improving, until this past weekend, when he had infx (folliculitis) and now HA increased.   Dx:   Post concussion syndrome  Post-concussion headache   PLAN: I spent 15 minutes of face to face time with patient. Greater than 50% of time was spent in counseling and coordination of care with patient. In summary we discussed:  - resume amitriptyline 25mg  qhs - rest, hydration, sleep, exercises and gradual increase in activities advised  Return in about 3 months (around 02/15/2015).     Suanne Marker, MD 11/15/2014, 1:09 PM Certified in Neurology, Neurophysiology and Neuroimaging  [facility] [address] [place], [place] [address] [phone]

## 2014-11-15 NOTE — Patient Instructions (Signed)
Continue amitriptyline

## 2015-02-05 ENCOUNTER — Ambulatory Visit: Payer: 59

## 2015-02-18 ENCOUNTER — Ambulatory Visit: Payer: 59 | Admitting: Diagnostic Neuroimaging

## 2015-07-10 MED FILL — HYDROCODON-APAP 5-325: 5-325 | 2 days supply | Qty: 16 | Fill #0

## 2016-01-01 DIAGNOSIS — F39 Unspecified mood [affective] disorder: Secondary | ICD-10-CM | POA: Diagnosis not present

## 2016-01-01 MED FILL — QUETIAPINE FUMARATE 100 MG: 100 | 30 days supply | Qty: 90 | Fill #0

## 2016-01-14 DIAGNOSIS — F39 Unspecified mood [affective] disorder: Secondary | ICD-10-CM | POA: Diagnosis not present

## 2016-01-20 DIAGNOSIS — F39 Unspecified mood [affective] disorder: Secondary | ICD-10-CM | POA: Diagnosis not present

## 2016-02-11 DIAGNOSIS — F39 Unspecified mood [affective] disorder: Secondary | ICD-10-CM | POA: Diagnosis not present

## 2016-02-12 MED FILL — QUETIAPINE FUMARATE 100 MG: 100 | 30 days supply | Qty: 60 | Fill #0

## 2016-02-13 DIAGNOSIS — F39 Unspecified mood [affective] disorder: Secondary | ICD-10-CM | POA: Diagnosis not present

## 2016-02-25 DIAGNOSIS — Z23 Encounter for immunization: Secondary | ICD-10-CM | POA: Diagnosis not present

## 2016-02-25 DIAGNOSIS — Z0001 Encounter for general adult medical examination with abnormal findings: Secondary | ICD-10-CM | POA: Diagnosis not present

## 2016-02-25 DIAGNOSIS — B360 Pityriasis versicolor: Secondary | ICD-10-CM | POA: Diagnosis not present

## 2016-02-25 DIAGNOSIS — F520 Hypoactive sexual desire disorder: Secondary | ICD-10-CM | POA: Diagnosis not present

## 2016-03-06 DIAGNOSIS — F39 Unspecified mood [affective] disorder: Secondary | ICD-10-CM | POA: Diagnosis not present

## 2016-03-17 MED FILL — QUETIAPINE FUMARATE 100 MG: 100 | 30 days supply | Qty: 60 | Fill #1

## 2016-04-02 DIAGNOSIS — F39 Unspecified mood [affective] disorder: Secondary | ICD-10-CM | POA: Diagnosis not present

## 2016-04-08 DIAGNOSIS — F39 Unspecified mood [affective] disorder: Secondary | ICD-10-CM | POA: Diagnosis not present

## 2016-04-08 MED FILL — QUETIAPINE FUMARATE 100 MG: 100 | 30 days supply | Qty: 60 | Fill #0

## 2016-04-08 MED FILL — BUPROPION HCL XL 150 MG TAB: 150 | 30 days supply | Qty: 30 | Fill #0

## 2016-05-01 DIAGNOSIS — H5213 Myopia, bilateral: Secondary | ICD-10-CM | POA: Diagnosis not present

## 2016-06-11 DIAGNOSIS — F39 Unspecified mood [affective] disorder: Secondary | ICD-10-CM | POA: Diagnosis not present

## 2016-06-11 MED FILL — DIVALPROEX SOD ER 500 MG TA: 500 | 30 days supply | Qty: 60 | Fill #0

## 2016-07-08 DIAGNOSIS — F39 Unspecified mood [affective] disorder: Secondary | ICD-10-CM | POA: Diagnosis not present

## 2016-07-10 MED FILL — DIVALPROEX SOD ER 500 MG TA: 500 | 30 days supply | Qty: 60 | Fill #0

## 2016-08-17 MED FILL — DIVALPROEX SOD ER 500 MG TA: 500 | 30 days supply | Qty: 60 | Fill #1

## 2016-08-24 DIAGNOSIS — F39 Unspecified mood [affective] disorder: Secondary | ICD-10-CM | POA: Diagnosis not present

## 2016-10-21 MED FILL — AMOXICILLIN 500 MG CAPSULE: 500 | 8 days supply | Qty: 24 | Fill #0

## 2017-06-15 ENCOUNTER — Other Ambulatory Visit: Payer: Self-pay

## 2017-06-15 ENCOUNTER — Ambulatory Visit (INDEPENDENT_AMBULATORY_CARE_PROVIDER_SITE_OTHER): Payer: No Typology Code available for payment source | Admitting: Urgent Care

## 2017-06-15 ENCOUNTER — Encounter: Payer: Self-pay | Admitting: Urgent Care

## 2017-06-15 VITALS — BP 112/72 | HR 66 | Temp 98.0°F | Resp 16 | Ht 70.08 in | Wt 176.0 lb

## 2017-06-15 DIAGNOSIS — Z114 Encounter for screening for human immunodeficiency virus [HIV]: Secondary | ICD-10-CM | POA: Diagnosis not present

## 2017-06-15 DIAGNOSIS — Z83438 Family history of other disorder of lipoprotein metabolism and other lipidemia: Secondary | ICD-10-CM

## 2017-06-15 DIAGNOSIS — Z0000 Encounter for general adult medical examination without abnormal findings: Secondary | ICD-10-CM

## 2017-06-15 MED ORDER — MELOXICAM 15 MG PO TABS: 7.5000 mg | ORAL_TABLET | Freq: Every day | ORAL | 1 refills | Status: DC

## 2017-06-15 NOTE — Patient Instructions (Addendum)
Health Maintenance, Male A healthy lifestyle and preventive care is important for your health and wellness. Ask your health care provider about what schedule of regular examinations is right for you. What should I know about weight and diet? Eat a Healthy Diet  Eat plenty of vegetables, fruits, whole grains, low-fat dairy products, and lean protein.  Do not eat a lot of foods high in solid fats, added sugars, or salt.  Maintain a Healthy Weight Regular exercise can help you achieve or maintain a healthy weight. You should:  Do at least 150 minutes of exercise each week. The exercise should increase your heart rate and make you sweat (moderate-intensity exercise).  Do strength-training exercises at least twice a week.  Watch Your Levels of Cholesterol and Blood Lipids  Have your blood tested for lipids and cholesterol every 5 years starting at 40 years of age. If you are at high risk for heart disease, you should start having your blood tested when you are 40 years old. You may need to have your cholesterol levels checked more often if: ? Your lipid or cholesterol levels are high. ? You are older than 40 years of age. ? You are at high risk for heart disease.  What should I know about cancer screening? Many types of cancers can be detected early and may often be prevented. Lung Cancer  You should be screened every year for lung cancer if: ? You are a current smoker who has smoked for at least 30 years. ? You are a former smoker who has quit within the past 15 years.  Talk to your health care provider about your screening options, when you should start screening, and how often you should be screened.  Colorectal Cancer  Routine colorectal cancer screening usually begins at 40 years of age and should be repeated every 5-10 years until you are 40 years old. You may need to be screened more often if early forms of precancerous polyps or small growths are found. Your health care provider  may recommend screening at an earlier age if you have risk factors for colon cancer.  Your health care provider may recommend using home test kits to check for hidden blood in the stool.  A small camera at the end of a tube can be used to examine your colon (sigmoidoscopy or colonoscopy). This checks for the earliest forms of colorectal cancer.  Prostate and Testicular Cancer  Depending on your age and overall health, your health care provider may do certain tests to screen for prostate and testicular cancer.  Talk to your health care provider about any symptoms or concerns you have about testicular or prostate cancer.  Skin Cancer  Check your skin from head to toe regularly.  Tell your health care provider about any new moles or changes in moles, especially if: ? There is a change in a mole's size, shape, or color. ? You have a mole that is larger than a pencil eraser.  Always use sunscreen. Apply sunscreen liberally and repeat throughout the day.  Protect yourself by wearing long sleeves, pants, a wide-brimmed hat, and sunglasses when outside.  What should I know about heart disease, diabetes, and high blood pressure?  If you are 18-39 years of age, have your blood pressure checked every 3-5 years. If you are 40 years of age or older, have your blood pressure checked every year. You should have your blood pressure measured twice-once when you are at a hospital or clinic, and once when   you are not at a hospital or clinic. Record the average of the two measurements. To check your blood pressure when you are not at a hospital or clinic, you can use: ? An automated blood pressure machine at a pharmacy. ? A home blood pressure monitor.  Talk to your health care provider about your target blood pressure.  If you are between 45-79 years old, ask your health care provider if you should take aspirin to prevent heart disease.  Have regular diabetes screenings by checking your fasting blood  sugar level. ? If you are at a normal weight and have a low risk for diabetes, have this test once every three years after the age of 45. ? If you are overweight and have a high risk for diabetes, consider being tested at a younger age or more often.  A one-time screening for abdominal aortic aneurysm (AAA) by ultrasound is recommended for men aged 65-75 years who are current or former smokers. What should I know about preventing infection? Hepatitis B If you have a higher risk for hepatitis B, you should be screened for this virus. Talk with your health care provider to find out if you are at risk for hepatitis B infection. Hepatitis C Blood testing is recommended for:  Everyone born from 1945 through 1965.  Anyone with known risk factors for hepatitis C.  Sexually Transmitted Diseases (STDs)  You should be screened each year for STDs including gonorrhea and chlamydia if: ? You are sexually active and are younger than 40 years of age. ? You are older than 40 years of age and your health care provider tells you that you are at risk for this type of infection. ? Your sexual activity has changed since you were last screened and you are at an increased risk for chlamydia or gonorrhea. Ask your health care provider if you are at risk.  Talk with your health care provider about whether you are at high risk of being infected with HIV. Your health care provider may recommend a prescription medicine to help prevent HIV infection.  What else can I do?  Schedule regular health, dental, and eye exams.  Stay current with your vaccines (immunizations).  Do not use any tobacco products, such as cigarettes, chewing tobacco, and e-cigarettes. If you need help quitting, ask your health care provider.  Limit alcohol intake to no more than 2 drinks per day. One drink equals 12 ounces of beer, 5 ounces of wine, or 1 ounces of hard liquor.  Do not use street drugs.  Do not share needles.  Ask your  health care provider for help if you need support or information about quitting drugs.  Tell your health care provider if you often feel depressed.  Tell your health care provider if you have ever been abused or do not feel safe at home. This information is not intended to replace advice given to you by your health care provider. Make sure you discuss any questions you have with your health care provider. Document Released: 11/14/2007 Document Revised: 01/15/2016 Document Reviewed: 02/19/2015 Elsevier Interactive Patient Education  2018 [employer]     IF you received an x-ray today, you will receive an invoice from [facility]. Please contact [facility] at [phone] with questions or concerns regarding your invoice.   IF you received labwork today, you will receive an invoice from [facility]. Please contact [facility] at [phone] with questions or concerns regarding your invoice.   Our billing staff will not be   able to assist you with questions regarding bills from these companies.  You will be contacted with the lab results as soon as they are available. The fastest way to get your results is to activate your My Chart account. Instructions are located on the last page of this paperwork. If you have not heard from us regarding the results in 2 weeks, please contact this office.       

## 2017-06-15 NOTE — Progress Notes (Signed)
MRN: [id number]  Subjective:   [name] is a 42 y.o. male presenting for annual physical exam. Patient is married, sexually active with his wife only. Works as a Insurance account managervalet supervisor. Has good relationships at home, has a good support network. Uses Vape, has a history of smoking cigarettes. Has ~10 drinks of alcohol per week.  Medical care team includes: PCP: Shade FloodGreene, Jeffrey R, MD Vision: Does not have visual deficits. Dental: Cleanings every 6 months. Specialists: None.   Carl Moss has a current medication list which includes the following prescription(s): acetaminophen and ibuprofen. He has No Known Allergies. Carl Moss  has a past medical history of Concussion (2012). Also  has a past surgical history that includes Shoulder surgery (Right, 2011). His family history includes Angioedema in his daughter; Hyperlipidemia in his father; Hypertension in his father.  Immunizations: Will update tdap today.  Review of Systems  Constitutional: Negative for chills, diaphoresis, fever, malaise/fatigue and weight loss.  HENT: Negative for congestion, ear discharge, ear pain, hearing loss, nosebleeds, sore throat and tinnitus.   Eyes: Negative for blurred vision, double vision, photophobia, pain, discharge and redness.  Respiratory: Negative for cough, shortness of breath and wheezing.   Cardiovascular: Negative for chest pain, palpitations and leg swelling.  Gastrointestinal: Negative for abdominal pain, blood in stool, constipation, diarrhea, nausea and vomiting.  Genitourinary: Negative for dysuria, flank pain, frequency, hematuria and urgency.  Musculoskeletal: Negative for back pain, joint pain and myalgias.  Skin: Negative for itching and rash.  Neurological: Negative for dizziness, tingling, seizures, loss of consciousness, weakness and headaches.  Endo/Heme/Allergies: Negative for polydipsia.  Psychiatric/Behavioral: Negative for depression, hallucinations, memory loss, substance  abuse and suicidal ideas. The patient is not nervous/anxious and does not have insomnia.    Objective:   Vitals: BP 112/72   Pulse 66   Temp 98 F (36.7 C) (Oral)   Resp 16   Ht 5' 10.08" (1.78 m)   Wt 176 lb (79.8 kg)   SpO2 98%   BMI 25.20 kg/m    Visual Acuity Screening   Right eye Left eye Both eyes  Without correction: 20/20 20/20 20/20   With correction:       Physical Exam  Constitutional: He is oriented to person, place, and time. He appears well-developed and well-nourished.  HENT:  TM's intact bilaterally, no effusions or erythema. Nasal turbinates pink and moist, nasal passages patent. No sinus tenderness. Oropharynx clear, mucous membranes moist, dentition in good repair.  Eyes: Conjunctivae and EOM are normal. Pupils are equal, round, and reactive to light. Right eye exhibits no discharge. Left eye exhibits no discharge. No scleral icterus.  Neck: Normal range of motion. Neck supple. No thyromegaly present.  Cardiovascular: Normal rate, regular rhythm and intact distal pulses. Exam reveals no gallop and no friction rub.  No murmur heard. Pulmonary/Chest: No stridor. No respiratory distress. He has no wheezes. He has no rales.  Abdominal: Soft. Bowel sounds are normal. He exhibits no distension and no mass. There is no tenderness.  Musculoskeletal: Normal range of motion. He exhibits no edema or tenderness.  Lymphadenopathy:    He has no cervical adenopathy.  Neurological: He is alert and oriented to person, place, and time. He has normal reflexes. He displays normal reflexes. Coordination normal.  Skin: Skin is warm and dry. No rash noted. No erythema. No pallor.  Psychiatric: He has a normal mood and affect.   Assessment and Plan :   Annual physical exam - Plan: Comprehensive metabolic panel, CBC, Lipid  panel, TSH  Family history of hyperlipidemia  Screening for HIV without presence of risk factors - Plan: HIV antibody  Medically stable, very pleasant  person. Labs pending. He had concerns about a rash he gets on his back over the summer, responds well with antifungal treatment. I counseled that he can always come in for an office visit when he gets a rash. Discussed healthy lifestyle, diet, exercise, preventative care, vaccinations, and addressed patient's concerns. Tdap is not in stock today so we could not update this.   Wallis Bamberg, PA-C Primary Care at [facility] [phone] 06/15/2017  8:28 AM

## 2017-06-16 LAB — COMPREHENSIVE METABOLIC PANEL
ALBUMIN: 4.6 g/dL (ref 3.5–5.5)
ALK PHOS: 61 IU/L (ref 39–117)
ALT: 18 IU/L (ref 0–44)
AST: 20 IU/L (ref 0–40)
Albumin/Globulin Ratio: 1.8 (ref 1.2–2.2)
BILIRUBIN TOTAL: 1.1 mg/dL (ref 0.0–1.2)
BUN/Creatinine Ratio: 13 (ref 9–20)
BUN: 12 mg/dL (ref 6–20)
CHLORIDE: 101 mmol/L (ref 96–106)
CO2: 26 mmol/L (ref 20–29)
Calcium: 9.6 mg/dL (ref 8.7–10.2)
Creatinine, Ser: 0.95 mg/dL (ref 0.76–1.27)
GFR calc Af Amer: 116 mL/min/{1.73_m2} (ref >59)
GFR calc non Af Amer: 100 mL/min/{1.73_m2} (ref >59)
GLUCOSE: 87 mg/dL (ref 65–99)
Globulin, Total: 2.6 g/dL (ref 1.5–4.5)
Potassium: 4.4 mmol/L (ref 3.5–5.2)
Sodium: 141 mmol/L (ref 134–144)
Total Protein: 7.2 g/dL (ref 6.0–8.5)

## 2017-06-16 LAB — LIPID PANEL
Chol/HDL Ratio: 5 ratio (ref 0.0–5.0)
Cholesterol, Total: 218 mg/dL — ABNORMAL HIGH (ref 100–199)
HDL: 44 mg/dL (ref >39)
LDL Calculated: 138 mg/dL — ABNORMAL HIGH (ref 0–99)
TRIGLYCERIDES: 179 mg/dL — AB (ref 0–149)
VLDL CHOLESTEROL CAL: 36 mg/dL (ref 5–40)

## 2017-06-16 LAB — CBC
Hematocrit: 43.1 % (ref 37.5–51.0)
Hemoglobin: 14.4 g/dL (ref 13.0–17.7)
MCH: 28.8 pg (ref 26.6–33.0)
MCHC: 33.4 g/dL (ref 31.5–35.7)
MCV: 86 fL (ref 79–97)
Platelets: 254 10*3/uL (ref 150–379)
RBC: 5 x10E6/uL (ref 4.14–5.80)
RDW: 13.2 % (ref 12.3–15.4)
WBC: 5.7 10*3/uL (ref 3.4–10.8)

## 2017-06-16 LAB — TSH: TSH: 1.73 u[IU]/mL (ref 0.450–4.500)

## 2017-06-16 LAB — HIV ANTIBODY (ROUTINE TESTING W REFLEX): HIV Screen 4th Generation wRfx: NONREACTIVE

## 2018-03-07 MED FILL — AMOXICILLIN 875 MG TABS: 875 | 10 days supply | Qty: 20 | Fill #0

## 2018-05-15 ENCOUNTER — Telehealth: Payer: No Typology Code available for payment source | Admitting: Family

## 2018-05-15 DIAGNOSIS — B360 Pityriasis versicolor: Secondary | ICD-10-CM

## 2018-05-15 MED ORDER — KETOCONAZOLE 2 % EX CREA: 1.0000 "application " | TOPICAL_CREAM | Freq: Two times a day (BID) | CUTANEOUS | 0 refills | Status: DC

## 2018-05-15 NOTE — Progress Notes (Signed)
E Visit for Rash  We are sorry that you are not feeling well. Here is how we plan to help!  Based on your symptoms it looks like you have  Versicolor. I have sent in a prescription of Ketoconazole 2% cream that you apply twice a day for two weeks. I can not send oral medication for this through an evisit related to possible side effects.    HOME CARE:    Take cool showers and avoid direct sunlight.  Apply cool compress or wet dressings.  Take a bath in an oatmeal bath.  Sprinkle content of one Aveeno packet under running faucet with comfortably warm water.  Bathe for 15-20 minutes, 1-2 times daily.  Pat dry with a towel. Do not rub the rash.  Use hydrocortisone cream.  Take an antihistamine like Benadryl for widespread rashes that itch.  The adult dose of Benadryl is 25-50 mg by mouth 4 times daily.  Caution:  This type of medication may cause sleepiness.  Do not drink alcohol, drive, or operate dangerous machinery while taking antihistamines.  Do not take these medications if you have prostate enlargement.  Read package instructions thoroughly on all medications that you take.  GET HELP RIGHT AWAY IF:   Symptoms don't go away after treatment.  Severe itching that persists.  If you rash spreads or swells.  If you rash begins to smell.  If it blisters and opens or develops a yellow-brown crust.  You develop a fever.  You have a sore throat.  You become short of breath.  MAKE SURE YOU:  Understand these instructions. Will watch your condition. Will get help right away if you are not doing well or get worse.  Thank you for choosing an e-visit. Your e-visit answers were reviewed by a board certified advanced clinical practitioner to complete your personal care plan. Depending upon the condition, your plan could have included both over the counter or prescription medications. Please review your pharmacy choice. Be sure that the pharmacy you have chosen is open so that you  can pick up your prescription now.  If there is a problem you may message your provider in MyChart to have the prescription routed to another pharmacy. Your safety is important to us. If you have drug allergies check your prescription carefully.  For the next 24 hours, you can use MyChart to ask questions about today's visit, request a non-urgent call back, or ask for a work or school excuse from your e-visit provider. You will get an email in the next two days asking about your experience. I hope that your e-visit has been valuable and will speed your recovery.

## 2018-05-21 ENCOUNTER — Ambulatory Visit (INDEPENDENT_AMBULATORY_CARE_PROVIDER_SITE_OTHER): Payer: No Typology Code available for payment source | Admitting: Emergency Medicine

## 2018-05-21 ENCOUNTER — Encounter: Payer: Self-pay | Admitting: Emergency Medicine

## 2018-05-21 ENCOUNTER — Other Ambulatory Visit: Payer: Self-pay

## 2018-05-21 VITALS — BP 126/71 | HR 78 | Temp 98.5°F | Resp 16 | Ht 70.08 in | Wt 188.2 lb

## 2018-05-21 DIAGNOSIS — B360 Pityriasis versicolor: Secondary | ICD-10-CM | POA: Diagnosis not present

## 2018-05-21 DIAGNOSIS — Z23 Encounter for immunization: Secondary | ICD-10-CM | POA: Diagnosis not present

## 2018-05-21 MED ORDER — KETOCONAZOLE 200 MG PO TABS: 200.0000 mg | ORAL_TABLET | Freq: Every day | ORAL | 3 refills | Status: AC

## 2018-05-21 NOTE — Patient Instructions (Addendum)
     If you have lab work done today you will be contacted with your lab results within the next 2 weeks.  If you have not heard from us then please contact us. The fastest way to get your results is to register for My Chart.   IF you received an x-ray today, you will receive an invoice from [facility]. Please contact [facility] at [phone] with questions or concerns regarding your invoice.   IF you received labwork today, you will receive an invoice from [facility]. Please contact [facility] at [phone] with questions or concerns regarding your invoice.   Our billing staff will not be able to assist you with questions regarding bills from these companies.  You will be contacted with the lab results as soon as they are available. The fastest way to get your results is to activate your My Chart account. Instructions are located on the last page of this paperwork. If you have not heard from us regarding the results in 2 weeks, please contact this office.     Tinea Versicolor  Tinea versicolor is a skin infection. It is caused by a type of yeast. It is normal for some yeast to be on your skin, but too much yeast causes this infection. The infection causes a rash of light or dark patches on your skin. The rash is most common on the chest, back, neck, or upper arms. The infection usually does not cause other problems. If it is treated, it will probably go away in a few weeks. The infection cannot be spread from one person to another (is not contagious). Follow these instructions at home:  Use over-the-counter and prescription medicines only as told by your doctor.  Scrub your skin every day with dandruff shampoo as told by your doctor.  Do not scratch your skin in the rash area.  Avoid places that are hot and humid.  Do not use tanning booths.  Try to avoid sweating a lot. Contact a doctor if:  Your symptoms get worse.  You have a fever.  You have redness,  swelling, or pain in the rash area.  You have fluid or blood coming from your rash.  Your rash feels warm to the touch.  You have pus or a bad smell coming from your rash.  Your rash comes back (recurs) after treatment. Summary  Tinea versicolor is a skin infection. It causes a rash of light or dark patches on your skin.  The rash is most common on the chest, back, neck, or upper arms. This infection usually does not cause other problems.  Use over-the-counter and prescription medicines only as told by your doctor.  If the infection is treated, it will probably go away in a few weeks. This information is not intended to replace advice given to you by your health care provider. Make sure you discuss any questions you have with your health care provider. Document Released: 04/30/2008 Document Revised: 01/18/2017 Document Reviewed: 01/18/2017 Elsevier Interactive Patient Education  2019 [employer]

## 2018-05-21 NOTE — Progress Notes (Signed)
Carl Moss 42 y.o.   Chief Complaint  Patient presents with  . rash all over    intermittent all his life (teenage yrs) Rash is all over.  Not using any otc meds for rash.  Itchy and red    HISTORY OF PRESENT ILLNESS: This is a 42 y.o. male with a history of a tinea versicolor yearly outbreaks complaining of a typical rash mostly on his trunk.  States it usually goes away when he takes ketoconazole.  No other significant symptoms and no variations this time.  HPI   Prior to Admission medications   Medication Sig Start Date End Date Taking? Authorizing Provider  acetaminophen (TYLENOL) 500 MG tablet Take 1,000 mg by mouth every 6 (six) hours as needed.   Yes [provider]  ibuprofen (ADVIL,MOTRIN) 600 MG tablet Take 600 mg by mouth every 6 (six) hours as needed.   Yes [provider]  ketoconazole (NIZORAL) 2 % cream Apply 1 application topically 2 (two) times daily. Patient not taking: Reported on 05/21/2018 05/15/18   Junie Spencer, FNP  meloxicam (MOBIC) 15 MG tablet Take 0.5-1 tablets (7.5-15 mg total) by mouth daily. Patient not taking: Reported on 05/21/2018 06/15/17   Wallis Bamberg, PA-C    No Known Allergies  Patient Active Problem List   Diagnosis Date Noted  . Lymphadenopathy, axillary 11/12/2014  . Folliculitis 11/12/2014    Past Medical History:  Diagnosis Date  . Concussion 2012    Past Surgical History:  Procedure Laterality Date  . SHOULDER SURGERY Right 2011    Social History   Socioeconomic History  . Marital status: Married    Spouse name: Huntley Dec  . Number of children: 2  . Years of education: college   . Highest education level: Not on file  Occupational History  . Occupation: [employer]  Social Needs  . Financial resource strain: Not on file  . Food insecurity:    Worry: Not on file    Inability: Not on file  . Transportation needs:    Medical: Not on file    Non-medical: Not on file  Tobacco Use  . Smoking status:  Former Smoker    Packs/day: 0.75    Years: 17.00    Pack years: 12.75    Types: Cigarettes    Last attempt to quit: 04/15/2013    Years since quitting: 5.1  . Smokeless tobacco: Never Used  Substance and Sexual Activity  . Alcohol use: No    Alcohol/week: 0.0 standard drinks  . Drug use: No  . Sexual activity: Yes    Birth control/protection: None  Lifestyle  . Physical activity:    Days per week: Not on file    Minutes per session: Not on file  . Stress: Not on file  Relationships  . Social connections:    Talks on phone: Not on file    Gets together: Not on file    Attends religious service: Not on file    Active member of club or organization: Not on file    Attends meetings of clubs or organizations: Not on file    Relationship status: Not on file  . Intimate partner violence:    Fear of current or ex partner: Not on file    Emotionally abused: Not on file    Physically abused: Not on file    Forced sexual activity: Not on file  Other Topics Concern  . Not on file  Social History Narrative   Lives at  home with his wife and children   Drinks about 18 oz of caffeine a day     Family History  Problem Relation Age of Onset  . Hyperlipidemia Father   . Hypertension Father   . Angioedema Daughter      Review of Systems  Constitutional: Negative.  Negative for chills and fever.  HENT: Negative for sore throat.   Eyes: Negative.   Respiratory: Negative for cough and shortness of breath.   Cardiovascular: Negative for chest pain and palpitations.  Gastrointestinal: Negative for abdominal pain, nausea and vomiting.  Genitourinary: Negative.   Musculoskeletal: Negative.   Skin: Positive for itching and rash.  Neurological: Negative.  Negative for dizziness and headaches.  Endo/Heme/Allergies: Negative.      Vitals:   05/21/18 1041  BP: 126/71  Pulse: 78  Resp: 16  Temp: 98.5 F (36.9 C)  SpO2: 97%     Physical Exam Vitals signs reviewed.    Constitutional:      Appearance: Normal appearance.  HENT:     Head: Normocephalic and atraumatic.  Eyes:     Extraocular Movements: Extraocular movements intact.     Pupils: Pupils are equal, round, and reactive to light.  Neck:     Musculoskeletal: Normal range of motion.  Cardiovascular:     Rate and Rhythm: Normal rate and regular rhythm.     Heart sounds: Normal heart sounds.  Pulmonary:     Effort: Pulmonary effort is normal.     Breath sounds: Normal breath sounds.  Musculoskeletal: Normal range of motion.  Skin:    Capillary Refill: Capillary refill takes less than 2 seconds.     Findings: Rash (Trunk and upper extremities) present.  Neurological:     General: No focal deficit present.     Mental Status: He is alert and oriented to person, place, and time.  Psychiatric:        Mood and Affect: Mood normal.        Behavior: Behavior normal.      ASSESSMENT & PLAN: Carl Moss was seen today for rash all over.  Diagnoses and all orders for this visit:  Tinea versicolor -     ketoconazole (NIZORAL) 200 MG tablet; Take 1 tablet (200 mg total) by mouth daily for 10 days.  Need for prophylactic vaccination and inoculation against influenza  Other orders -     Flu Vaccine QUAD 36+ mos IM    Patient Instructions       If you have lab work done today you will be contacted with your lab results within the next 2 weeks.  If you have not heard from us then please contact us. The fastest way to get your results is to register for My Chart.   IF you received an x-ray today, you will receive an invoice from [facility]. Please contact [facility] at [phone] with questions or concerns regarding your invoice.   IF you received labwork today, you will receive an invoice from [facility]. Please contact [facility] at [phone] with questions or concerns regarding your invoice.   Our billing staff will not be able to assist you with questions regarding  bills from these companies.  You will be contacted with the lab results as soon as they are available. The fastest way to get your results is to activate your My Chart account. Instructions are located on the last page of this paperwork. If you have not heard from us regarding the results in 2 weeks, please contact  this office.     Tinea Versicolor  Tinea versicolor is a skin infection. It is caused by a type of yeast. It is normal for some yeast to be on your skin, but too much yeast causes this infection. The infection causes a rash of light or dark patches on your skin. The rash is most common on the chest, back, neck, or upper arms. The infection usually does not cause other problems. If it is treated, it will probably go away in a few weeks. The infection cannot be spread from one person to another (is not contagious). Follow these instructions at home:  Use over-the-counter and prescription medicines only as told by your doctor.  Scrub your skin every day with dandruff shampoo as told by your doctor.  Do not scratch your skin in the rash area.  Avoid places that are hot and humid.  Do not use tanning booths.  Try to avoid sweating a lot. Contact a doctor if:  Your symptoms get worse.  You have a fever.  You have redness, swelling, or pain in the rash area.  You have fluid or blood coming from your rash.  Your rash feels warm to the touch.  You have pus or a bad smell coming from your rash.  Your rash comes back (recurs) after treatment. Summary  Tinea versicolor is a skin infection. It causes a rash of light or dark patches on your skin.  The rash is most common on the chest, back, neck, or upper arms. This infection usually does not cause other problems.  Use over-the-counter and prescription medicines only as told by your doctor.  If the infection is treated, it will probably go away in a few weeks. This information is not intended to replace advice given to you  by your health care provider. Make sure you discuss any questions you have with your health care provider. Document Released: 04/30/2008 Document Revised: 01/18/2017 Document Reviewed: 01/18/2017 Elsevier Interactive Patient Education  2019 Elsevier Inc.      Edwina BarthMiguel Jerusha Reising, MD [facility]

## 2018-05-23 MED FILL — KETOCONAZOLE 200 MG TABLET: 200 | 10 days supply | Qty: 10 | Fill #0

## 2019-12-26 ENCOUNTER — Other Ambulatory Visit: Payer: Self-pay | Admitting: Orthopedic Surgery

## 2019-12-26 DIAGNOSIS — M25561 Pain in right knee: Secondary | ICD-10-CM

## 2019-12-26 DIAGNOSIS — R609 Edema, unspecified: Secondary | ICD-10-CM

## 2022-01-15 ENCOUNTER — Other Ambulatory Visit (HOSPITAL_COMMUNITY): Payer: Self-pay

## 2022-01-15 MED ORDER — DEBACTEROL 30-50 % MT SOLN
OROMUCOSAL | 1 refills | Status: DC
  Filled 2022-01-15: qty 12, 30d supply, fill #0
  Filled 2022-11-14: qty 12, 30d supply, fill #1

## 2022-01-16 ENCOUNTER — Other Ambulatory Visit (HOSPITAL_COMMUNITY): Payer: Self-pay

## 2022-01-19 ENCOUNTER — Other Ambulatory Visit (HOSPITAL_COMMUNITY): Payer: Self-pay

## 2022-03-23 ENCOUNTER — Other Ambulatory Visit (HOSPITAL_COMMUNITY): Payer: Self-pay

## 2022-03-23 MED ORDER — FLUCONAZOLE 200 MG PO TABS
200.0000 mg | ORAL_TABLET | Freq: Every day | ORAL | 0 refills | Status: DC
  Filled 2022-03-23: qty 7, 7d supply, fill #0

## 2022-03-25 ENCOUNTER — Other Ambulatory Visit (HOSPITAL_COMMUNITY): Payer: Self-pay

## 2022-03-25 MED ORDER — ERGOCALCIFEROL 1.25 MG (50000 UT) PO CAPS
50000.0000 [IU] | ORAL_CAPSULE | ORAL | 0 refills | Status: DC
  Filled 2022-03-25: qty 12, 84d supply, fill #0

## 2022-11-20 ENCOUNTER — Other Ambulatory Visit: Payer: Self-pay

## 2022-12-11 ENCOUNTER — Other Ambulatory Visit (HOSPITAL_COMMUNITY): Payer: Self-pay

## 2022-12-31 ENCOUNTER — Other Ambulatory Visit: Payer: Self-pay

## 2024-03-09 ENCOUNTER — Ambulatory Visit

## 2024-03-09 VITALS — BP 116/77 | HR 103 | Temp 97.9°F | Ht 70.0 in | Wt 184.1 lb

## 2024-03-09 DIAGNOSIS — Z1159 Encounter for screening for other viral diseases: Secondary | ICD-10-CM | POA: Diagnosis not present

## 2024-03-09 DIAGNOSIS — E785 Hyperlipidemia, unspecified: Secondary | ICD-10-CM | POA: Diagnosis not present

## 2024-03-09 DIAGNOSIS — Z1329 Encounter for screening for other suspected endocrine disorder: Secondary | ICD-10-CM

## 2024-03-09 DIAGNOSIS — Z1211 Encounter for screening for malignant neoplasm of colon: Secondary | ICD-10-CM

## 2024-03-09 DIAGNOSIS — Z13228 Encounter for screening for other metabolic disorders: Secondary | ICD-10-CM

## 2024-03-09 DIAGNOSIS — Z1321 Encounter for screening for nutritional disorder: Secondary | ICD-10-CM

## 2024-03-09 DIAGNOSIS — Z130 Encounter for screening for diseases of the blood and blood-forming organs and certain disorders involving the immune mechanism: Secondary | ICD-10-CM | POA: Diagnosis not present

## 2024-03-09 DIAGNOSIS — Z23 Encounter for immunization: Secondary | ICD-10-CM | POA: Diagnosis not present

## 2024-03-09 DIAGNOSIS — J349 Unspecified disorder of nose and nasal sinuses: Secondary | ICD-10-CM | POA: Insufficient documentation

## 2024-03-09 DIAGNOSIS — B360 Pityriasis versicolor: Secondary | ICD-10-CM | POA: Diagnosis not present

## 2024-03-09 DIAGNOSIS — Z1212 Encounter for screening for malignant neoplasm of rectum: Secondary | ICD-10-CM

## 2024-03-09 MED ORDER — FLUCONAZOLE 200 MG PO TABS
200.0000 mg | ORAL_TABLET | Freq: Every day | ORAL | 0 refills | Status: AC
Start: 2024-03-09 — End: ?

## 2024-03-09 MED ORDER — FLUTICASONE PROPIONATE 50 MCG/ACT NA SUSP: 2.0000 | Freq: Every day | NASAL | 6 refills | Status: AC

## 2024-03-09 NOTE — Progress Notes (Signed)
 New Patient Office Visit  Subjective    Patient ID: Carl Moss, male    DOB: 10-17-1975  Age: 48 y.o. MRN: [id number]  CC: No chief complaint on file.  History of Present Illness   Carl Moss is a 48 year old male who presents to establish care and address skin concerns.  Cutaneous hypopigmented macules (tinea versicolor) - Long-standing hypopigmented macules primarily on the back, consistent with tinea versicolor - Flare-ups triggered by heat and sweating - Intermittent use of fluconazole ; typically uses half of a ten-pill prescription per recurrence, resulting in symptom resolution for several months - No pruritus associated with lesions - Positive family history in father and son  Vesicular rash with post-inflammatory hyperpigmentation (possible herpes zoster) - Approximately one month ago, developed 'weird spots' on the side, described as a possible shingles outbreak - Lesions were asymptomatic, without pain or discomfort - History of childhood varicella (chickenpox) - Residual slight discoloration at the site of the rash - Rash resolved and is no longer causing him any issues.  Nasal obstruction - History of oral abscess treated with antibiotics several years ago - Since abscess, persistent sensation of right nostril being more closed than normal - Describes nasal obstruction as an annoyance rather than a significant problem - Trial of nasal sprays in the past with uncertain efficacy or compliance  - No significant seasonal allergy symptoms  Dyslipidemia risk - Family history of dyslipidemia, specifically high LDL and low HDL cholesterol - Believes he has inherited this lipid profile from his father     Screenings:  Colon Cancer: Due; order for Cologuard placed today. Lung Cancer: N/A Breast Cancer: N/A Diabetes: Checking A1c with labs HLD: Checking cholesterol panel with labs The 10-year ASCVD risk score (Arnett DK, et al., 2019) is: 1.8%  Outpatient  Encounter Medications as of 03/09/2024  Medication Sig   fluticasone (FLONASE) 50 MCG/ACT nasal spray Place 2 sprays into both nostrils daily.   [DISCONTINUED] acetaminophen  (TYLENOL ) 500 MG tablet Take 1,000 mg by mouth every 6 (six) hours as needed.   [DISCONTINUED] ergocalciferol  (VITAMIN D2) 1.25 MG (50000 UT) capsule Take 1 capsule (50,000 Units total) by mouth every 7 (seven) days.   [DISCONTINUED] fluconazole  (DIFLUCAN ) 200 MG tablet Take 1 tablet (200 mg total) by mouth daily.   [DISCONTINUED] ibuprofen (ADVIL,MOTRIN) 600 MG tablet Take 600 mg by mouth every 6 (six) hours as needed.   [DISCONTINUED] ketoconazole  (NIZORAL ) 2 % cream Apply 1 application topically 2 (two) times daily.   [DISCONTINUED] meloxicam  (MOBIC ) 15 MG tablet Take 0.5-1 tablets (7.5-15 mg total) by mouth daily.   [DISCONTINUED] Sulfuric Acid -Sulf Phenolics (DEBACTEROL) 30-50 % SOLN Apply to canker sore as directed for 5 seconds. Rinse with water and spit.   fluconazole  (DIFLUCAN ) 200 MG tablet Take 1 tablet (200 mg total) by mouth daily.   No facility-administered encounter medications on file as of 03/09/2024.    Past Medical History:  Diagnosis Date   Concussion 2012    Past Surgical History:  Procedure Laterality Date   SHOULDER SURGERY Right 2011    Family History  Problem Relation Age of Onset   Hyperlipidemia Father    Hypertension Father    Angioedema Daughter     Social History   Socioeconomic History   Marital status: Married    Spouse name: Camie   Number of children: 2   Years of education: college    Highest education level: Not on file  Occupational History   Occupation: Pharmacist, community  Tobacco Use   Smoking status: Former    Current packs/day: 0.00    Average packs/day: 0.8 packs/day for 17.0 years (12.8 ttl pk-yrs)    Types: Cigarettes    Start date: 04/15/1996    Quit date: 04/15/2013    Years since quitting: 10.9   Smokeless tobacco: Never  Substance and Sexual Activity   Alcohol  use: No    Alcohol/week: 0.0 standard drinks of alcohol   Drug use: No   Sexual activity: Yes    Birth control/protection: None  Other Topics Concern   Not on file  Social History Narrative   Lives at home with his wife and children   Drinks about 18 oz of caffeine a day    Social Drivers of Corporate investment banker Strain: Not on file  Food Insecurity: Not on file  Transportation Needs: Not on file  Physical Activity: Not on file  Stress: Not on file  Social Connections: Not on file  Intimate Partner Violence: Not on file    ROS  Per HPI      Objective    BP 116/77   Pulse (!) 103   Temp 97.9 F (36.6 C) (Oral)   Ht 5' 10 (1.778 m)   Wt 184 lb 1.9 oz (83.5 kg)   SpO2 96%   BMI 26.42 kg/m   Physical Exam      Assessment & Plan:   Encounter for vaccination -     Tdap vaccine greater than or equal to 7yo IM  Encounter for colorectal cancer screening using Cologuard test -     Cologuard  Tinea versicolor Assessment & Plan: Chronic tinea versicolor on the back, exacerbated by heat and sweat. Previously treated with fluconazole  with good response. Discussed potential liver damage with prolonged fluconazole  use. Dermatology referral suggested for additional management options. - Prescribe fluconazole  200 mg once daily x 5 days. - Refer to dermatology for further evaluation and management.  Orders: -     Fluconazole ; Take 1 tablet (200 mg total) by mouth daily.  Dispense: 5 tablet; Refill: 0 -     Ambulatory referral to Dermatology  Screening for endocrine, nutritional, metabolic and immunity disorder -     VITAMIN D  25 Hydroxy (Vit-D Deficiency, Fractures); Future -     TSH; Future -     Hemoglobin A1c; Future -     Lipid panel; Future -     Comprehensive metabolic panel with GFR; Future -     CBC with Differential/Platelet; Future -     Lipoprotein A (LPA); Future -     Apolipoprotein B; Future  Screening for viral disease -     Hepatitis C  antibody; Future  Dyslipidemia Assessment & Plan: Suspected dyslipidemia with family history. Previous reports of high LDL and low HDL. Pending lab results for confirmation. Discussed potential need for cholesterol medication based on genetic markers and lab results. - Order fasting blood work including cholesterol panel. - Order LPA and ApoB tests due to family history. -If elevated with updated blood work, consider starting rosuvastatin 5 mg and calculating ASCVD risk for documentation.   Unspecified disorder of nose and nasal sinuses Assessment & Plan: Chronic right nasal obstruction possibly due to inflamed turbinates. No nasal polyps observed. Symptoms persistent for several years. Previous use of nasal sprays without consistent use. Discussed proper nasal spray technique and the need for consistent use over several weeks. - Prescribe fluticasone nasal spray, two sprays in each nostril once daily for  three to four weeks. - Advise to start nasal spray after returning from travel. - Consider ENT referral if symptoms persist after trial of nasal spray   Other orders -     Fluticasone Propionate; Place 2 sprays into both nostrils daily.  Dispense: 16 g; Refill: 6   Return in about 1 year (around 03/09/2025) for Physical.   Saddie JULIANNA Sacks, PA-C

## 2024-03-09 NOTE — Assessment & Plan Note (Signed)
 Chronic right nasal obstruction possibly due to inflamed turbinates. No nasal polyps observed. Symptoms persistent for several years. Previous use of nasal sprays without consistent use. Discussed proper nasal spray technique and the need for consistent use over several weeks. - Prescribe fluticasone nasal spray, two sprays in each nostril once daily for three to four weeks. - Advise to start nasal spray after returning from travel. - Consider ENT referral if symptoms persist after trial of nasal spray

## 2024-03-09 NOTE — Assessment & Plan Note (Signed)
 Suspected dyslipidemia with family history. Previous reports of high LDL and low HDL. Pending lab results for confirmation. Discussed potential need for cholesterol medication based on genetic markers and lab results. - Order fasting blood work including cholesterol panel. - Order LPA and ApoB tests due to family history. -If elevated with updated blood work, consider starting rosuvastatin 5 mg and calculating ASCVD risk for documentation.

## 2024-03-09 NOTE — Assessment & Plan Note (Signed)
 Chronic tinea versicolor on the back, exacerbated by heat and sweat. Previously treated with fluconazole  with good response. Discussed potential liver damage with prolonged fluconazole  use. Dermatology referral suggested for additional management options. - Prescribe fluconazole  200 mg once daily x 5 days. - Refer to dermatology for further evaluation and management.

## 2024-03-09 NOTE — Patient Instructions (Addendum)
 VISIT SUMMARY: Today, you came in for a wellness visit and to address some skin concerns. We discussed your skin conditions, nasal obstruction, and potential dyslipidemia. We also administered a tetanus vaccine and ordered several lab tests.  YOUR PLAN: TINEA VERSICOLOR (BACK): You have a chronic skin condition on your back that gets worse with heat and sweating. -Take fluconazole  200 mg once per day for 5 days. -We will refer you to a dermatologist for further evaluation and management.  VESICULAR RASH WITH POST-INFLAMMATORY HYPERPIGMENTATION (POSSIBLE SHINGLES): You had a rash about a month ago that might have been shingles. There is some slight discoloration left at the site. -No specific treatment needed at this time. Monitor the area for any changes.  CHRONIC RIGHT NASAL OBSTRUCTION: You have a persistent feeling of blockage in your right nostril, possibly due to inflamed turbinates. -Use fluticasone nasal spray, two sprays in each nostril once daily for three to four weeks. -Start using the nasal spray after you return from travel. -If symptoms persist after using the nasal spray, we may refer you to an ENT specialist.  DYSLIPIDEMIA RISK: You have a family history of high cholesterol levels, and we suspect you might have inherited this condition. -We have ordered fasting blood work including a cholesterol panel, LPA, and ApoB tests. -Depending on the results, we may discuss cholesterol medication.  ADULT WELLNESS VISIT: This was a routine wellness visit. Your blood pressure was slightly elevated, and you are due for some vaccinations and screenings. -We administered a tetanus vaccine today. -We have ordered fasting blood work including cholesterol panel, A1c, thyroid, kidney, liver function, and blood counts. -We have ordered a Cologuard test for colon cancer screening.  If you have any problems before your next visit feel free to message me via MyChart (minor issues or questions) or  call the office, otherwise you may reach out to schedule an office visit.  Thank you! Saddie Sacks, PA-C

## 2024-03-10 ENCOUNTER — Ambulatory Visit: Payer: Self-pay

## 2024-03-10 LAB — CBC WITH DIFFERENTIAL/PLATELET
Basophils Absolute: 0 x10E3/uL (ref 0.0–0.2)
Basos: 1 %
EOS (ABSOLUTE): 0.1 x10E3/uL (ref 0.0–0.4)
Eos: 2 %
Hematocrit: 44.5 % (ref 37.5–51.0)
Hemoglobin: 14.5 g/dL (ref 13.0–17.7)
Immature Grans (Abs): 0 x10E3/uL (ref 0.0–0.1)
Immature Granulocytes: 0 %
Lymphocytes Absolute: 2.3 x10E3/uL (ref 0.7–3.1)
Lymphs: 41 %
MCH: 28.5 pg (ref 26.6–33.0)
MCHC: 32.6 g/dL (ref 31.5–35.7)
MCV: 88 fL (ref 79–97)
Monocytes Absolute: 0.4 x10E3/uL (ref 0.1–0.9)
Monocytes: 6 %
Neutrophils Absolute: 2.8 x10E3/uL (ref 1.4–7.0)
Neutrophils: 50 %
Platelets: 207 x10E3/uL (ref 150–450)
RBC: 5.08 x10E6/uL (ref 4.14–5.80)
RDW: 12.1 % (ref 11.6–15.4)
WBC: 5.6 x10E3/uL (ref 3.4–10.8)

## 2024-03-10 LAB — COMPREHENSIVE METABOLIC PANEL WITH GFR
ALT: 15 IU/L (ref 0–44)
AST: 20 IU/L (ref 0–40)
Albumin: 4.8 g/dL (ref 4.1–5.1)
Alkaline Phosphatase: 64 IU/L (ref 47–123)
BUN/Creatinine Ratio: 10 (ref 9–20)
BUN: 11 mg/dL (ref 6–24)
Bilirubin Total: 1 mg/dL (ref 0.0–1.2)
CO2: 24 mmol/L (ref 20–29)
Calcium: 10 mg/dL (ref 8.7–10.2)
Chloride: 103 mmol/L (ref 96–106)
Creatinine, Ser: 1.1 mg/dL (ref 0.76–1.27)
Globulin, Total: 2.8 g/dL (ref 1.5–4.5)
Glucose: 84 mg/dL (ref 70–99)
Potassium: 4.8 mmol/L (ref 3.5–5.2)
Sodium: 142 mmol/L (ref 134–144)
Total Protein: 7.6 g/dL (ref 6.0–8.5)
eGFR: 84 mL/min/1.73 (ref >59)

## 2024-03-10 LAB — HEMOGLOBIN A1C
Est. average glucose Bld gHb Est-mCnc: 108 mg/dL
Hgb A1c MFr Bld: 5.4 % (ref 4.8–5.6)

## 2024-03-10 LAB — LIPOPROTEIN A (LPA): Lipoprotein (a): 13.3 nmol/L (ref <75.0)

## 2024-03-10 LAB — LIPID PANEL
Chol/HDL Ratio: 3.8 ratio (ref 0.0–5.0)
Cholesterol, Total: 215 mg/dL — ABNORMAL HIGH (ref 100–199)
HDL: 56 mg/dL (ref >39)
LDL Chol Calc (NIH): 144 mg/dL — ABNORMAL HIGH (ref 0–99)
Triglycerides: 85 mg/dL (ref 0–149)
VLDL Cholesterol Cal: 15 mg/dL (ref 5–40)

## 2024-03-10 LAB — TSH: TSH: 1.11 u[IU]/mL (ref 0.450–4.500)

## 2024-03-10 LAB — HEPATITIS C ANTIBODY: Hep C Virus Ab: NONREACTIVE

## 2024-03-10 LAB — APOLIPOPROTEIN B: Apolipoprotein B: 115 mg/dL — ABNORMAL HIGH (ref <90)

## 2024-03-10 LAB — VITAMIN D 25 HYDROXY (VIT D DEFICIENCY, FRACTURES): Vit D, 25-Hydroxy: 20.4 ng/mL — ABNORMAL LOW (ref 30.0–100.0)

## 2024-03-14 ENCOUNTER — Other Ambulatory Visit: Payer: Self-pay

## 2024-03-16 DIAGNOSIS — Z1212 Encounter for screening for malignant neoplasm of rectum: Secondary | ICD-10-CM | POA: Diagnosis not present

## 2024-03-22 LAB — COLOGUARD: COLOGUARD: NEGATIVE
